# Patient Record
Sex: Female | Born: 1935 | Race: Black or African American | Hispanic: No | Smoking: Never smoker
Health system: Southern US, Community
[De-identification: ages and names within clinical notes are randomized; demographics above are authoritative.]

## PROBLEM LIST (undated history)

## (undated) DIAGNOSIS — I10 Essential (primary) hypertension: Secondary | ICD-10-CM

## (undated) DIAGNOSIS — E039 Hypothyroidism, unspecified: Secondary | ICD-10-CM

## (undated) DIAGNOSIS — J302 Other seasonal allergic rhinitis: Secondary | ICD-10-CM

## (undated) DIAGNOSIS — M1990 Unspecified osteoarthritis, unspecified site: Secondary | ICD-10-CM

## (undated) DIAGNOSIS — I639 Cerebral infarction, unspecified: Secondary | ICD-10-CM

## (undated) DIAGNOSIS — IMO0001 Reserved for inherently not codable concepts without codable children: Secondary | ICD-10-CM

## (undated) DIAGNOSIS — K5792 Diverticulitis of intestine, part unspecified, without perforation or abscess without bleeding: Secondary | ICD-10-CM

## (undated) DIAGNOSIS — K219 Gastro-esophageal reflux disease without esophagitis: Secondary | ICD-10-CM

## (undated) HISTORY — PX: OTHER SURGICAL HISTORY: SHX169

## (undated) HISTORY — PX: CHOLECYSTECTOMY: SHX55

## (undated) HISTORY — PX: CARDIAC CATHETERIZATION: SHX172

## (undated) HISTORY — PX: BACK SURGERY: SHX140

## (undated) HISTORY — PX: ROTATOR CUFF REPAIR: SHX139

## (undated) HISTORY — PX: BUNIONECTOMY: SHX129

---

## 2015-04-09 ENCOUNTER — Other Ambulatory Visit: Payer: Self-pay | Admitting: Specialist

## 2015-04-09 DIAGNOSIS — M545 Low back pain: Secondary | ICD-10-CM

## 2015-04-23 ENCOUNTER — Ambulatory Visit
Admission: RE | Admit: 2015-04-23 | Discharge: 2015-04-23 | Disposition: A | Discharge status: Home / Self Care | Payer: Medicare Other | Source: Ambulatory Visit | Attending: Specialist | Admitting: Specialist

## 2015-04-23 DIAGNOSIS — M545 Low back pain: Secondary | ICD-10-CM

## 2015-05-13 ENCOUNTER — Other Ambulatory Visit (HOSPITAL_COMMUNITY): Payer: Self-pay | Admitting: Specialist

## 2015-05-20 ENCOUNTER — Encounter (HOSPITAL_COMMUNITY)
Admission: RE | Admit: 2015-05-20 | Discharge: 2015-05-20 | Disposition: A | Discharge status: Home / Self Care | Payer: Medicare Other | Source: Ambulatory Visit | Attending: Specialist | Admitting: Specialist

## 2015-05-20 ENCOUNTER — Encounter (HOSPITAL_COMMUNITY): Payer: Self-pay

## 2015-05-20 DIAGNOSIS — R001 Bradycardia, unspecified: Secondary | ICD-10-CM | POA: Diagnosis not present

## 2015-05-20 DIAGNOSIS — M4806 Spinal stenosis, lumbar region: Secondary | ICD-10-CM | POA: Diagnosis not present

## 2015-05-20 DIAGNOSIS — I10 Essential (primary) hypertension: Secondary | ICD-10-CM | POA: Diagnosis not present

## 2015-05-20 DIAGNOSIS — K219 Gastro-esophageal reflux disease without esophagitis: Secondary | ICD-10-CM | POA: Diagnosis not present

## 2015-05-20 DIAGNOSIS — Z7902 Long term (current) use of antithrombotics/antiplatelets: Secondary | ICD-10-CM | POA: Diagnosis not present

## 2015-05-20 DIAGNOSIS — Z8673 Personal history of transient ischemic attack (TIA), and cerebral infarction without residual deficits: Secondary | ICD-10-CM | POA: Insufficient documentation

## 2015-05-20 DIAGNOSIS — Z79899 Other long term (current) drug therapy: Secondary | ICD-10-CM | POA: Diagnosis not present

## 2015-05-20 DIAGNOSIS — Z0183 Encounter for blood typing: Secondary | ICD-10-CM | POA: Insufficient documentation

## 2015-05-20 DIAGNOSIS — Z87891 Personal history of nicotine dependence: Secondary | ICD-10-CM | POA: Insufficient documentation

## 2015-05-20 DIAGNOSIS — M4316 Spondylolisthesis, lumbar region: Secondary | ICD-10-CM | POA: Insufficient documentation

## 2015-05-20 DIAGNOSIS — Z7982 Long term (current) use of aspirin: Secondary | ICD-10-CM | POA: Insufficient documentation

## 2015-05-20 DIAGNOSIS — E039 Hypothyroidism, unspecified: Secondary | ICD-10-CM | POA: Diagnosis not present

## 2015-05-20 DIAGNOSIS — Z01812 Encounter for preprocedural laboratory examination: Secondary | ICD-10-CM | POA: Diagnosis not present

## 2015-05-20 DIAGNOSIS — Z01818 Encounter for other preprocedural examination: Secondary | ICD-10-CM | POA: Diagnosis not present

## 2015-05-20 HISTORY — DX: Diverticulitis of intestine, part unspecified, without perforation or abscess without bleeding: K57.92

## 2015-05-20 HISTORY — DX: Gastro-esophageal reflux disease without esophagitis: K21.9

## 2015-05-20 HISTORY — DX: Cerebral infarction, unspecified: I63.9

## 2015-05-20 HISTORY — DX: Essential (primary) hypertension: I10

## 2015-05-20 HISTORY — DX: Hypothyroidism, unspecified: E03.9

## 2015-05-20 HISTORY — DX: Unspecified osteoarthritis, unspecified site: M19.90

## 2015-05-20 HISTORY — DX: Reserved for inherently not codable concepts without codable children: IMO0001

## 2015-05-20 HISTORY — DX: Other seasonal allergic rhinitis: J30.2

## 2015-05-20 LAB — COMPREHENSIVE METABOLIC PANEL
ALT: 20 U/L (ref 14–54)
ANION GAP: 10 (ref 5–15)
AST: 26 U/L (ref 15–41)
Albumin: 3.8 g/dL (ref 3.5–5.0)
Alkaline Phosphatase: 57 U/L (ref 38–126)
BILIRUBIN TOTAL: 0.4 mg/dL (ref 0.3–1.2)
BUN: 24 mg/dL — ABNORMAL HIGH (ref 6–20)
CO2: 25 mmol/L (ref 22–32)
Calcium: 9.4 mg/dL (ref 8.9–10.3)
Chloride: 106 mmol/L (ref 101–111)
Creatinine, Ser: 1.67 mg/dL — ABNORMAL HIGH (ref 0.44–1.00)
GFR calc Af Amer: 32 mL/min — ABNORMAL LOW (ref >60)
GFR calc non Af Amer: 28 mL/min — ABNORMAL LOW (ref >60)
Glucose, Bld: 88 mg/dL (ref 65–99)
Potassium: 3.7 mmol/L (ref 3.5–5.1)
Sodium: 141 mmol/L (ref 135–145)
TOTAL PROTEIN: 7.3 g/dL (ref 6.5–8.1)

## 2015-05-20 LAB — CBC
HCT: 33.5 % — ABNORMAL LOW (ref 36.0–46.0)
Hemoglobin: 10.5 g/dL — ABNORMAL LOW (ref 12.0–15.0)
MCH: 29.2 pg (ref 26.0–34.0)
MCHC: 31.3 g/dL (ref 30.0–36.0)
MCV: 93.1 fL (ref 78.0–100.0)
Platelets: 232 10*3/uL (ref 150–400)
RBC: 3.6 MIL/uL — AB (ref 3.87–5.11)
RDW: 13.8 % (ref 11.5–15.5)
WBC: 6.1 10*3/uL (ref 4.0–10.5)

## 2015-05-20 LAB — SURGICAL PCR SCREEN
MRSA, PCR: NEGATIVE
Staphylococcus aureus: NEGATIVE

## 2015-05-20 NOTE — Pre-Procedure Instructions (Signed)
    Caroline Ware  05/20/2015      [name] - [place], [place] - [address] AT [facility] [address] [place] [address] Phone: [phone] Fax: [phone]    Your procedure is scheduled on Tuesday, June 14th, 2016 at 10:05.  Report to [name] Admitting at 8:05 A.M.  Call this number if you have problems the morning of surgery:  [phone]   Remember:  Do not eat food or drink liquids after midnight.  Take these medicines the morning of surgery with A SIP OF WATER Diazepam (Valium) if needed, Hydrocodone-acetaminophen (Norco) if needed, lansoprazole (Prevacid), synthroid.   Stop taking: Aleve, Aspirin, Ibuprofen, herbal medications, fish oils, Plavix.    Do not wear jewelry, make-up or nail polish.  Do not wear lotions, powders, or perfumes.  You may wear deodorant.  Do not shave 48 hours prior to surgery.    Do not bring valuables to the hospital.  [name] is not responsible for any belongings or valuables.  Contacts, dentures or bridgework may not be worn into surgery.  Leave your suitcase in the car.  After surgery it may be brought to your room.  For patients admitted to the hospital, discharge time will be determined by your treatment team.  Patients discharged the day of surgery will not be allowed to drive home.    Special instructions:  See attached  Please read over the following fact sheets that you were given. Pain Booklet, Coughing and Deep Breathing, Blood Transfusion Information, MRSA Information and Surgical Site Infection Prevention

## 2015-05-20 NOTE — Progress Notes (Signed)
PCP- pt. States she see Dr. Melvyn Neth at [facility], [phone]  Cardiologist- Pt. Denies seeing a cardiologist within the last 5 years  Cardiac Cath- Pt. States it has been greater than 5 years and does not remember the date  Echo- pt. Denies  Stress test- pt. Denies within last 5 years

## 2015-05-21 NOTE — Progress Notes (Addendum)
Anesthesia Chart Review:  Pt is 81 year old female scheduled for L3-4, L4-5 lumbar decompressive laminectomy, L4-5 transforaminal lumbar interbody fusion on 05/28/2015 with Dr. Otelia Sergeant.   PCP is Dr. Early Chars at [facility] in [place]  PMH includes: HTN, TIA (09/2014; by Dr. Melvyn Neth' notes carotids and echo "ok"), hypothyroidism, GERD. Former smoker. BMI 34.   Medications include: ASA, plavix, losartan, pravachol, dyazide, prevacid, synthroid. Per Dr. Melvyn Neth' note in care everywhere dated 05/17/15, pt can stop plavix 10-14 days prior to surgery if needed.   Preoperative labs reviewed.  Cr 1.67. Previous results in care everywhere from 2015 show pt's cr ranging from 1.32-1.46  EKG 05/20/15: sinus bradycardia (49 bpm).   Pt reports having cardiac cath over 5 years ago, but cannot remember date. Has office visits with PCP in care everywhere dating back to 2008 and he makes no mention of CAD in his notes, in fact he specifically states pt has no hx of cardiac ischemia.   Pt has medical clearance for procedure from Dr. Melvyn Neth conditionally on labs to be obtained at PAT. I have faxed him pt's lab results and asked if he feels pt is ok for surgery. Awaiting response.   Rica Mast, FNP-BC [facility] Stay Surgical Center/Anesthesiology Phone: [phone] 05/21/2015 4:49 PM  Addendum:  Sherron Monday with Merry Proud, Dr. Melvyn Neth' nurse at [facility]. Dr. Melvyn Neth feels pt is ok to proceed with surgery with a cr of 1.67.   If no changes, I anticipate pt can proceed with surgery as scheduled.   Rica Mast, FNP-BC [facility] Short Stay Surgical Center/Anesthesiology Phone: [phone] 05/24/2015 1:12 PM

## 2015-05-25 ENCOUNTER — Encounter (HOSPITAL_COMMUNITY): Payer: Self-pay | Admitting: *Deleted

## 2015-05-25 ENCOUNTER — Inpatient Hospital Stay (HOSPITAL_COMMUNITY)
Admission: RE | Admit: 2015-05-25 | Discharge: 2015-05-28 | DRG: 460 | Disposition: A | Discharge status: Skilled Nursing Facility | Payer: Medicare Other | Source: Ambulatory Visit | Attending: Specialist | Admitting: Specialist

## 2015-05-25 ENCOUNTER — Inpatient Hospital Stay (HOSPITAL_COMMUNITY): Payer: Medicare Other | Admitting: Emergency Medicine

## 2015-05-25 ENCOUNTER — Inpatient Hospital Stay (HOSPITAL_COMMUNITY): Payer: Medicare Other | Admitting: Anesthesiology

## 2015-05-25 ENCOUNTER — Encounter (HOSPITAL_COMMUNITY)
Admission: RE | Disposition: A | Discharge status: Skilled Nursing Facility | Payer: Medicare Other | Source: Ambulatory Visit | Attending: Specialist

## 2015-05-25 ENCOUNTER — Inpatient Hospital Stay (HOSPITAL_COMMUNITY): Payer: Medicare Other

## 2015-05-25 DIAGNOSIS — Z91041 Radiographic dye allergy status: Secondary | ICD-10-CM | POA: Diagnosis not present

## 2015-05-25 DIAGNOSIS — I10 Essential (primary) hypertension: Secondary | ICD-10-CM | POA: Diagnosis present

## 2015-05-25 DIAGNOSIS — E039 Hypothyroidism, unspecified: Secondary | ICD-10-CM | POA: Diagnosis present

## 2015-05-25 DIAGNOSIS — Z981 Arthrodesis status: Secondary | ICD-10-CM | POA: Diagnosis not present

## 2015-05-25 DIAGNOSIS — M549 Dorsalgia, unspecified: Secondary | ICD-10-CM | POA: Diagnosis present

## 2015-05-25 DIAGNOSIS — Z87891 Personal history of nicotine dependence: Secondary | ICD-10-CM

## 2015-05-25 DIAGNOSIS — M48062 Spinal stenosis, lumbar region with neurogenic claudication: Secondary | ICD-10-CM | POA: Diagnosis present

## 2015-05-25 DIAGNOSIS — Z969 Presence of functional implant, unspecified: Secondary | ICD-10-CM

## 2015-05-25 DIAGNOSIS — Z7902 Long term (current) use of antithrombotics/antiplatelets: Secondary | ICD-10-CM

## 2015-05-25 DIAGNOSIS — Z79891 Long term (current) use of opiate analgesic: Secondary | ICD-10-CM

## 2015-05-25 DIAGNOSIS — Z91013 Allergy to seafood: Secondary | ICD-10-CM

## 2015-05-25 DIAGNOSIS — Z7982 Long term (current) use of aspirin: Secondary | ICD-10-CM | POA: Diagnosis not present

## 2015-05-25 DIAGNOSIS — Z880 Allergy status to penicillin: Secondary | ICD-10-CM | POA: Diagnosis not present

## 2015-05-25 DIAGNOSIS — M4316 Spondylolisthesis, lumbar region: Secondary | ICD-10-CM | POA: Diagnosis present

## 2015-05-25 DIAGNOSIS — J302 Other seasonal allergic rhinitis: Secondary | ICD-10-CM | POA: Diagnosis present

## 2015-05-25 DIAGNOSIS — M4806 Spinal stenosis, lumbar region: Principal | ICD-10-CM | POA: Diagnosis present

## 2015-05-25 DIAGNOSIS — Z888 Allergy status to other drugs, medicaments and biological substances status: Secondary | ICD-10-CM

## 2015-05-25 DIAGNOSIS — I959 Hypotension, unspecified: Secondary | ICD-10-CM | POA: Diagnosis not present

## 2015-05-25 DIAGNOSIS — R509 Fever, unspecified: Secondary | ICD-10-CM

## 2015-05-25 DIAGNOSIS — Z91010 Allergy to peanuts: Secondary | ICD-10-CM | POA: Diagnosis not present

## 2015-05-25 DIAGNOSIS — Z91030 Bee allergy status: Secondary | ICD-10-CM | POA: Diagnosis not present

## 2015-05-25 DIAGNOSIS — K219 Gastro-esophageal reflux disease without esophagitis: Secondary | ICD-10-CM | POA: Diagnosis present

## 2015-05-25 DIAGNOSIS — D62 Acute posthemorrhagic anemia: Secondary | ICD-10-CM | POA: Diagnosis not present

## 2015-05-25 DIAGNOSIS — Z79899 Other long term (current) drug therapy: Secondary | ICD-10-CM | POA: Diagnosis not present

## 2015-05-25 DIAGNOSIS — Z8673 Personal history of transient ischemic attack (TIA), and cerebral infarction without residual deficits: Secondary | ICD-10-CM

## 2015-05-25 DIAGNOSIS — N390 Urinary tract infection, site not specified: Secondary | ICD-10-CM | POA: Diagnosis not present

## 2015-05-25 DIAGNOSIS — J9811 Atelectasis: Secondary | ICD-10-CM | POA: Diagnosis not present

## 2015-05-25 DIAGNOSIS — Z419 Encounter for procedure for purposes other than remedying health state, unspecified: Secondary | ICD-10-CM

## 2015-05-25 HISTORY — PX: LUMBAR FUSION: SHX111

## 2015-05-25 LAB — TYPE AND SCREEN
ABO/RH(D): A NEG
ANTIBODY SCREEN: POSITIVE

## 2015-05-25 SURGERY — FUSION, SPINE, LUMBAR, MINIMALLY INVASIVE
Anesthesia: General | Site: Spine Lumbar

## 2015-05-25 MED ORDER — VANCOMYCIN HCL IN DEXTROSE 1-5 GM/200ML-% IV SOLN
1000.0000 mg | INTRAVENOUS | Status: AC
  Administered 2015-05-25: 1000 mg via INTRAVENOUS
  Filled 2015-05-25: qty 200

## 2015-05-25 MED ORDER — LIDOCAINE HCL (CARDIAC) 20 MG/ML IV SOLN
INTRAVENOUS | Status: AC
  Filled 2015-05-25: qty 5

## 2015-05-25 MED ORDER — TIMOLOL MALEATE 0.5 % OP SOLN
1.0000 [drp] | Freq: Two times a day (BID) | OPHTHALMIC | Status: DC
  Administered 2015-05-25 – 2015-05-28 (×6): 1 [drp] via OPHTHALMIC
  Filled 2015-05-25: qty 5

## 2015-05-25 MED ORDER — ONDANSETRON HCL 4 MG/2ML IJ SOLN: 4.0000 mg | Freq: Four times a day (QID) | INTRAMUSCULAR | Status: DC | PRN

## 2015-05-25 MED ORDER — CHLORHEXIDINE GLUCONATE 4 % EX LIQD: 60.0000 mL | Freq: Once | CUTANEOUS | Status: DC

## 2015-05-25 MED ORDER — ALBUMIN HUMAN 5 % IV SOLN
INTRAVENOUS | Status: DC | PRN
  Administered 2015-05-25 (×2): via INTRAVENOUS

## 2015-05-25 MED ORDER — BUPIVACAINE HCL 0.5 % IJ SOLN
INTRAMUSCULAR | Status: DC | PRN
  Administered 2015-05-25: 20 mL

## 2015-05-25 MED ORDER — HEMOSTATIC AGENTS (NO CHARGE) OPTIME
TOPICAL | Status: DC | PRN
  Administered 2015-05-25: 1 via TOPICAL

## 2015-05-25 MED ORDER — PHENYLEPHRINE HCL 10 MG/ML IJ SOLN
INTRAMUSCULAR | Status: AC
  Filled 2015-05-25: qty 3

## 2015-05-25 MED ORDER — BUPIVACAINE LIPOSOME 1.3 % IJ SUSP
20.0000 mL | INTRAMUSCULAR | Status: DC
  Filled 2015-05-25: qty 20

## 2015-05-25 MED ORDER — SUCCINYLCHOLINE CHLORIDE 20 MG/ML IJ SOLN
INTRAMUSCULAR | Status: AC
  Filled 2015-05-25: qty 1

## 2015-05-25 MED ORDER — THROMBIN 20000 UNITS EX KIT
PACK | CUTANEOUS | Status: DC | PRN
  Administered 2015-05-25: 20000 [IU] via TOPICAL

## 2015-05-25 MED ORDER — POTASSIUM CHLORIDE IN NACL 20-0.9 MEQ/L-% IV SOLN
INTRAVENOUS | Status: DC
Start: 2015-05-25 — End: 2015-05-28
  Administered 2015-05-25 – 2015-05-27 (×5): via INTRAVENOUS
  Filled 2015-05-25 (×5): qty 1000

## 2015-05-25 MED ORDER — POLYETHYLENE GLYCOL 3350 17 G PO PACK: 17.0000 g | PACK | Freq: Every day | ORAL | Status: DC | PRN

## 2015-05-25 MED ORDER — ONDANSETRON HCL 4 MG/2ML IJ SOLN: 4.0000 mg | INTRAMUSCULAR | Status: DC | PRN

## 2015-05-25 MED ORDER — BRIMONIDINE TARTRATE-TIMOLOL 0.2-0.5 % OP SOLN: 1.0000 [drp] | Freq: Two times a day (BID) | OPHTHALMIC | Status: DC

## 2015-05-25 MED ORDER — DOCUSATE SODIUM 100 MG PO CAPS
100.0000 mg | ORAL_CAPSULE | Freq: Two times a day (BID) | ORAL | Status: DC
  Administered 2015-05-25 – 2015-05-26 (×3): 100 mg via ORAL
  Filled 2015-05-25 (×3): qty 1

## 2015-05-25 MED ORDER — BISACODYL 5 MG PO TBEC: 5.0000 mg | DELAYED_RELEASE_TABLET | Freq: Every day | ORAL | Status: DC | PRN

## 2015-05-25 MED ORDER — PROPOFOL 10 MG/ML IV BOLUS
INTRAVENOUS | Status: DC | PRN
  Administered 2015-05-25: 120 mg via INTRAVENOUS

## 2015-05-25 MED ORDER — FLEET ENEMA 7-19 GM/118ML RE ENEM: 1.0000 | ENEMA | Freq: Once | RECTAL | Status: AC | PRN

## 2015-05-25 MED ORDER — PHENOL 1.4 % MT LIQD: 1.0000 | OROMUCOSAL | Status: DC | PRN

## 2015-05-25 MED ORDER — OXYCODONE HCL 5 MG/5ML PO SOLN: 5.0000 mg | Freq: Once | ORAL | Status: DC | PRN

## 2015-05-25 MED ORDER — MENTHOL 3 MG MT LOZG: 1.0000 | LOZENGE | OROMUCOSAL | Status: DC | PRN

## 2015-05-25 MED ORDER — NEOSTIGMINE METHYLSULFATE 10 MG/10ML IV SOLN
INTRAVENOUS | Status: DC | PRN
  Administered 2015-05-25: 4 mg via INTRAVENOUS

## 2015-05-25 MED ORDER — EPHEDRINE SULFATE 50 MG/ML IJ SOLN
INTRAMUSCULAR | Status: DC | PRN
  Administered 2015-05-25 (×3): 10 mg via INTRAVENOUS
  Administered 2015-05-25: 5 mg via INTRAVENOUS

## 2015-05-25 MED ORDER — PROPOFOL 10 MG/ML IV BOLUS
INTRAVENOUS | Status: AC
  Filled 2015-05-25: qty 20

## 2015-05-25 MED ORDER — HYDROCODONE-ACETAMINOPHEN 5-325 MG PO TABS
1.0000 | ORAL_TABLET | ORAL | Status: DC | PRN
  Administered 2015-05-27: 1 via ORAL
  Filled 2015-05-25: qty 1

## 2015-05-25 MED ORDER — FENTANYL CITRATE (PF) 100 MCG/2ML IJ SOLN
25.0000 ug | INTRAMUSCULAR | Status: DC | PRN
  Administered 2015-05-25: 50 ug via INTRAVENOUS

## 2015-05-25 MED ORDER — LOSARTAN POTASSIUM 50 MG PO TABS
50.0000 mg | ORAL_TABLET | Freq: Every day | ORAL | Status: DC
  Administered 2015-05-28: 50 mg via ORAL
  Filled 2015-05-25 (×2): qty 1

## 2015-05-25 MED ORDER — ROCURONIUM BROMIDE 100 MG/10ML IV SOLN
INTRAVENOUS | Status: DC | PRN
  Administered 2015-05-25: 40 mg via INTRAVENOUS
  Administered 2015-05-25 (×5): 10 mg via INTRAVENOUS

## 2015-05-25 MED ORDER — ALUM & MAG HYDROXIDE-SIMETH 200-200-20 MG/5ML PO SUSP: 30.0000 mL | Freq: Four times a day (QID) | ORAL | Status: DC | PRN

## 2015-05-25 MED ORDER — SURGIFOAM 100 EX MISC
CUTANEOUS | Status: DC | PRN
  Administered 2015-05-25: 1 via TOPICAL

## 2015-05-25 MED ORDER — VANCOMYCIN HCL IN DEXTROSE 1-5 GM/200ML-% IV SOLN
1000.0000 mg | Freq: Once | INTRAVENOUS | Status: AC
  Administered 2015-05-26: 1000 mg via INTRAVENOUS
  Filled 2015-05-25: qty 200

## 2015-05-25 MED ORDER — LIDOCAINE HCL (CARDIAC) 20 MG/ML IV SOLN
INTRAVENOUS | Status: DC | PRN
  Administered 2015-05-25: 60 mg via INTRAVENOUS

## 2015-05-25 MED ORDER — LACTATED RINGERS IV SOLN
INTRAVENOUS | Status: DC
  Administered 2015-05-25 (×4): via INTRAVENOUS

## 2015-05-25 MED ORDER — DIPHENHYDRAMINE HCL 50 MG/ML IJ SOLN
12.5000 mg | Freq: Once | INTRAMUSCULAR | Status: AC
  Administered 2015-05-25: 12.5 mg via INTRAVENOUS
  Filled 2015-05-25: qty 0.25
  Filled 2015-05-25: qty 1

## 2015-05-25 MED ORDER — THROMBIN 20000 UNITS EX SOLR
CUTANEOUS | Status: AC
  Filled 2015-05-25: qty 20000

## 2015-05-25 MED ORDER — FENTANYL CITRATE (PF) 100 MCG/2ML IJ SOLN
INTRAMUSCULAR | Status: AC
  Administered 2015-05-25: 50 ug via INTRAVENOUS
  Filled 2015-05-25: qty 2

## 2015-05-25 MED ORDER — FENTANYL CITRATE (PF) 250 MCG/5ML IJ SOLN
INTRAMUSCULAR | Status: AC
  Filled 2015-05-25: qty 5

## 2015-05-25 MED ORDER — SODIUM CHLORIDE 0.9 % IJ SOLN: 3.0000 mL | Freq: Two times a day (BID) | INTRAMUSCULAR | Status: DC

## 2015-05-25 MED ORDER — TRIAMTERENE-HCTZ 37.5-25 MG PO CAPS
1.0000 | ORAL_CAPSULE | Freq: Every day | ORAL | Status: DC
  Filled 2015-05-25: qty 1

## 2015-05-25 MED ORDER — SODIUM CHLORIDE 0.9 % IJ SOLN: 3.0000 mL | INTRAMUSCULAR | Status: DC | PRN

## 2015-05-25 MED ORDER — ARTIFICIAL TEARS OP OINT
TOPICAL_OINTMENT | OPHTHALMIC | Status: AC
  Filled 2015-05-25: qty 3.5

## 2015-05-25 MED ORDER — MORPHINE SULFATE 2 MG/ML IJ SOLN
1.0000 mg | INTRAMUSCULAR | Status: DC | PRN
  Administered 2015-05-25 – 2015-05-26 (×2): 2 mg via INTRAVENOUS
  Filled 2015-05-25 (×3): qty 1

## 2015-05-25 MED ORDER — SODIUM CHLORIDE 0.9 % IV SOLN: 250.0000 mL | INTRAVENOUS | Status: DC

## 2015-05-25 MED ORDER — 0.9 % SODIUM CHLORIDE (POUR BTL) OPTIME
TOPICAL | Status: DC | PRN
  Administered 2015-05-25 (×2): 1000 mL

## 2015-05-25 MED ORDER — PHENYLEPHRINE HCL 10 MG/ML IJ SOLN
INTRAMUSCULAR | Status: DC | PRN
  Administered 2015-05-25: 80 ug via INTRAVENOUS
  Administered 2015-05-25: 40 ug via INTRAVENOUS
  Administered 2015-05-25: 80 ug via INTRAVENOUS

## 2015-05-25 MED ORDER — OXYCODONE-ACETAMINOPHEN 5-325 MG PO TABS
1.0000 | ORAL_TABLET | ORAL | Status: DC | PRN
  Administered 2015-05-26: 2 via ORAL
  Filled 2015-05-25 (×2): qty 2

## 2015-05-25 MED ORDER — LATANOPROST 0.005 % OP SOLN
1.0000 [drp] | Freq: Every day | OPHTHALMIC | Status: DC
  Administered 2015-05-25 – 2015-05-28 (×4): 1 [drp] via OPHTHALMIC
  Filled 2015-05-25: qty 2.5

## 2015-05-25 MED ORDER — ACETAMINOPHEN 325 MG PO TABS
650.0000 mg | ORAL_TABLET | ORAL | Status: DC | PRN
  Administered 2015-05-27 (×3): 650 mg via ORAL
  Filled 2015-05-25 (×3): qty 2

## 2015-05-25 MED ORDER — BRIMONIDINE TARTRATE 0.2 % OP SOLN
1.0000 [drp] | Freq: Two times a day (BID) | OPHTHALMIC | Status: DC
  Administered 2015-05-25 – 2015-05-28 (×6): 1 [drp] via OPHTHALMIC
  Filled 2015-05-25: qty 5

## 2015-05-25 MED ORDER — LEVOTHYROXINE SODIUM 50 MCG PO TABS
50.0000 ug | ORAL_TABLET | Freq: Every day | ORAL | Status: DC
  Administered 2015-05-26 – 2015-05-28 (×3): 50 ug via ORAL
  Filled 2015-05-25 (×3): qty 1

## 2015-05-25 MED ORDER — ASPIRIN EC 81 MG PO TBEC
81.0000 mg | DELAYED_RELEASE_TABLET | Freq: Every day | ORAL | Status: DC
  Administered 2015-05-25 – 2015-05-27 (×3): 81 mg via ORAL
  Filled 2015-05-25 (×6): qty 1

## 2015-05-25 MED ORDER — GLYCOPYRROLATE 0.2 MG/ML IJ SOLN
INTRAMUSCULAR | Status: DC | PRN
  Administered 2015-05-25 (×2): 0.2 mg via INTRAVENOUS
  Administered 2015-05-25: 0.6 mg via INTRAVENOUS

## 2015-05-25 MED ORDER — ONDANSETRON HCL 4 MG/2ML IJ SOLN
INTRAMUSCULAR | Status: DC | PRN
  Administered 2015-05-25: 4 mg via INTRAVENOUS

## 2015-05-25 MED ORDER — ARTIFICIAL TEARS OP OINT
TOPICAL_OINTMENT | OPHTHALMIC | Status: DC | PRN
  Administered 2015-05-25: 1 via OPHTHALMIC

## 2015-05-25 MED ORDER — ACETAMINOPHEN 650 MG RE SUPP: 650.0000 mg | RECTAL | Status: DC | PRN

## 2015-05-25 MED ORDER — ROCURONIUM BROMIDE 50 MG/5ML IV SOLN
INTRAVENOUS | Status: AC
  Filled 2015-05-25: qty 1

## 2015-05-25 MED ORDER — PRAVASTATIN SODIUM 20 MG PO TABS
20.0000 mg | ORAL_TABLET | Freq: Every day | ORAL | Status: DC
  Administered 2015-05-26 – 2015-05-28 (×3): 20 mg via ORAL
  Filled 2015-05-25 (×3): qty 1

## 2015-05-25 MED ORDER — POTASSIUM CHLORIDE CRYS ER 20 MEQ PO TBCR
20.0000 meq | EXTENDED_RELEASE_TABLET | Freq: Every day | ORAL | Status: DC
  Administered 2015-05-26 – 2015-05-28 (×4): 20 meq via ORAL
  Filled 2015-05-25 (×3): qty 1

## 2015-05-25 MED ORDER — OXYCODONE HCL 5 MG PO TABS: 5.0000 mg | ORAL_TABLET | Freq: Once | ORAL | Status: DC | PRN

## 2015-05-25 MED ORDER — PHENYLEPHRINE HCL 10 MG/ML IJ SOLN
10.0000 mg | INTRAMUSCULAR | Status: DC | PRN
  Administered 2015-05-25: 20 ug/min via INTRAVENOUS

## 2015-05-25 MED ORDER — FENTANYL CITRATE (PF) 100 MCG/2ML IJ SOLN
INTRAMUSCULAR | Status: DC | PRN
  Administered 2015-05-25 (×5): 50 ug via INTRAVENOUS

## 2015-05-25 MED ORDER — CVS EASY FIBER PO PACK: 1.0000 | PACK | Freq: Every day | ORAL | Status: DC

## 2015-05-25 MED ORDER — TRIAMTERENE-HCTZ 37.5-25 MG PO TABS
1.0000 | ORAL_TABLET | Freq: Every day | ORAL | Status: DC
  Administered 2015-05-28: 1 via ORAL
  Filled 2015-05-25 (×2): qty 1

## 2015-05-25 MED ORDER — CALCIUM CARBONATE 600 MG PO TABS
600.0000 mg | ORAL_TABLET | Freq: Two times a day (BID) | ORAL | Status: DC
  Filled 2015-05-25 (×4): qty 1

## 2015-05-25 MED ORDER — BUPIVACAINE LIPOSOME 1.3 % IJ SUSP
INTRAMUSCULAR | Status: DC | PRN
  Administered 2015-05-25: 20 mL

## 2015-05-25 MED ORDER — ONDANSETRON HCL 4 MG/2ML IJ SOLN
INTRAMUSCULAR | Status: AC
  Filled 2015-05-25: qty 2

## 2015-05-25 MED ORDER — PANTOPRAZOLE SODIUM 40 MG PO TBEC
40.0000 mg | DELAYED_RELEASE_TABLET | Freq: Every day | ORAL | Status: DC
  Administered 2015-05-26 – 2015-05-28 (×3): 40 mg via ORAL
  Filled 2015-05-25 (×3): qty 1

## 2015-05-25 SURGICAL SUPPLY — 77 items
BAG DECANTER FOR FLEXI CONT (MISCELLANEOUS) ×3 IMPLANT
BLADE SURG ROTATE 9660 (MISCELLANEOUS) IMPLANT
BONE CANC CHIPS 20CC PCAN1/4 (Bone Implant) ×3 IMPLANT
BONE MATRIX VIVIGEN 1CC (Bone Implant) ×6 IMPLANT
BUR NEURO DRILL SOFT 3.0X3.8M (BURR) ×3 IMPLANT
BUR RND FLUTED 2.5 (BURR) IMPLANT
BUR SABER RD CUTTING 3.0 (BURR) ×2 IMPLANT
BUR SABER RD CUTTING 3.0MM (BURR) ×1
CAGE CONCORDE BULLET 9X11X23 (Cage) ×1 IMPLANT
CAGE CONCORDE BULLET 9X11X23MM (Cage) ×1 IMPLANT
CAGE SPNL PRLL BLT NOSE 23X9 (Cage) ×1 IMPLANT
CHIPS CANC BONE 20CC PCAN1/4 (Bone Implant) ×1 IMPLANT
CHLORAPREP W/TINT 26ML (MISCELLANEOUS) ×3 IMPLANT
CLOSURE WOUND 1/2 X4 (GAUZE/BANDAGES/DRESSINGS) ×1
COVER SURGICAL LIGHT HANDLE (MISCELLANEOUS) ×3 IMPLANT
DERMABOND ADVANCED (GAUZE/BANDAGES/DRESSINGS) ×2
DERMABOND ADVANCED .7 DNX12 (GAUZE/BANDAGES/DRESSINGS) ×1 IMPLANT
DRAPE C-ARM 42X72 X-RAY (DRAPES) ×3 IMPLANT
DRAPE C-ARMOR (DRAPES) ×3 IMPLANT
DRAPE INCISE 23X17 IOBAN STRL (DRAPES) ×4
DRAPE INCISE IOBAN 23X17 STRL (DRAPES) ×2 IMPLANT
DRAPE SURG 17X23 STRL (DRAPES) ×12 IMPLANT
DRAPE TABLE COVER HEAVY DUTY (DRAPES) ×3 IMPLANT
DRSG MEPILEX BORDER 4X4 (GAUZE/BANDAGES/DRESSINGS) IMPLANT
DRSG MEPILEX BORDER 4X8 (GAUZE/BANDAGES/DRESSINGS) ×3 IMPLANT
DURAPREP 26ML APPLICATOR (WOUND CARE) IMPLANT
ELECT BLADE 6.5 EXT (BLADE) ×3 IMPLANT
ELECT CAUTERY BLADE 6.4 (BLADE) ×3 IMPLANT
ELECT REM PT RETURN 9FT ADLT (ELECTROSURGICAL) ×3
ELECTRODE REM PT RTRN 9FT ADLT (ELECTROSURGICAL) ×1 IMPLANT
EVACUATOR 1/8 PVC DRAIN (DRAIN) IMPLANT
GAUZE SPONGE 4X4 12PLY STRL (GAUZE/BANDAGES/DRESSINGS) IMPLANT
GLOVE BIOGEL PI IND STRL 8 (GLOVE) ×2 IMPLANT
GLOVE BIOGEL PI INDICATOR 8 (GLOVE) ×4
GLOVE ECLIPSE 9.0 STRL (GLOVE) ×6 IMPLANT
GLOVE ORTHO TXT STRL SZ7.5 (GLOVE) ×6 IMPLANT
GLOVE SURG 8.5 LATEX PF (GLOVE) ×6 IMPLANT
GOWN STRL REUS W/ TWL LRG LVL3 (GOWN DISPOSABLE) ×3 IMPLANT
GOWN STRL REUS W/ TWL XL LVL3 (GOWN DISPOSABLE) ×2 IMPLANT
GOWN STRL REUS W/TWL 2XL LVL3 (GOWN DISPOSABLE) ×6 IMPLANT
GOWN STRL REUS W/TWL LRG LVL3 (GOWN DISPOSABLE) ×6
GOWN STRL REUS W/TWL XL LVL3 (GOWN DISPOSABLE) ×4
KIT BASIN OR (CUSTOM PROCEDURE TRAY) ×3 IMPLANT
KIT POSITION SURG JACKSON T1 (MISCELLANEOUS) ×3 IMPLANT
KIT ROOM TURNOVER OR (KITS) ×3 IMPLANT
NEEDLE 22X1 1/2 (OR ONLY) (NEEDLE) ×3 IMPLANT
NEEDLE ASP BONE MRW 11GX15 (NEEDLE) IMPLANT
NEEDLE BONE MARROW 8GAX6 (NEEDLE) IMPLANT
NEEDLE SPNL 18GX3.5 QUINCKE PK (NEEDLE) ×3 IMPLANT
NS IRRIG 1000ML POUR BTL (IV SOLUTION) ×6 IMPLANT
PACK LAMINECTOMY ORTHO (CUSTOM PROCEDURE TRAY) ×3 IMPLANT
PAD ARMBOARD 7.5X6 YLW CONV (MISCELLANEOUS) ×6 IMPLANT
PATTIES SURGICAL .75X.75 (GAUZE/BANDAGES/DRESSINGS) ×3 IMPLANT
PATTIES SURGICAL 1X1 (DISPOSABLE) IMPLANT
ROD PRE BENT EXPEDIUM 35MM (Rod) ×6 IMPLANT
SCREW SET SINGLE INNER (Screw) ×12 IMPLANT
SCREW VIPER CORT FIX 6.00X30 (Screw) ×3 IMPLANT
SCREW VIPER CORT FIX 6X35 (Screw) ×3 IMPLANT
SCREW VIPER CORTICAL FIX 6X40 (Screw) ×6 IMPLANT
SPONGE LAP 18X18 X RAY DECT (DISPOSABLE) ×3 IMPLANT
SPONGE SURGIFOAM ABS GEL 100 (HEMOSTASIS) ×3 IMPLANT
STRIP CLOSURE SKIN 1/2X4 (GAUZE/BANDAGES/DRESSINGS) ×2 IMPLANT
SURGIFLO W/THROMBIN 8M KIT (HEMOSTASIS) ×3 IMPLANT
SUT VIC AB 0 CT1 27 (SUTURE) ×2
SUT VIC AB 0 CT1 27XBRD ANBCTR (SUTURE) ×1 IMPLANT
SUT VIC AB 1 CTX 36 (SUTURE) ×2
SUT VIC AB 1 CTX36XBRD ANBCTR (SUTURE) ×1 IMPLANT
SUT VIC AB 2-0 CT1 27 (SUTURE) ×4
SUT VIC AB 2-0 CT1 TAPERPNT 27 (SUTURE) ×2 IMPLANT
SUT VIC AB 3-0 X1 27 (SUTURE) ×3 IMPLANT
SUT VICRYL 0 CT 1 36IN (SUTURE) IMPLANT
SYR 20CC LL (SYRINGE) ×3 IMPLANT
SYR CONTROL 10ML LL (SYRINGE) ×6 IMPLANT
TOWEL OR 17X24 6PK STRL BLUE (TOWEL DISPOSABLE) ×3 IMPLANT
TOWEL OR 17X26 10 PK STRL BLUE (TOWEL DISPOSABLE) ×3 IMPLANT
TRAY FOLEY CATH 16FRSI W/METER (SET/KITS/TRAYS/PACK) ×3 IMPLANT
YANKAUER SUCT BULB TIP NO VENT (SUCTIONS) ×3 IMPLANT

## 2015-05-25 NOTE — Brief Op Note (Addendum)
05/25/2015  3:49 PM  PATIENT:  Caroline Ware  81 y.o. female  PRE-OPERATIVE DIAGNOSIS:  Lumbar spinal stenosis L3-4, L4-5, grade II spondylolisthesis L4-5  POST-OPERATIVE DIAGNOSIS:  Lumbar spinal stenosis L3-4, L4-5, grade II spondylolisthesis L4-5  PROCEDURE:  Procedure(s): LUMBAR DECOMPRESSIVE LAMINECTOMY L3-4 AND L4-5, TRANSFORAMINAL LUMBAR INTERBODY FUSION L4-5 (N/A)Instrumentation with mPACT pedicle screws and rods L4-5, 11 concorde lordotic cage,vivigen.  SURGEON:  Surgeon(s) and Role: Kerrin Champagne, MD - Primary  PHYSICIAN ASSISTANT: Andee Lineman  ANESTHESIA:   local and general Dr. Chaney Malling.  EBL:  Total I/O In: 2050 [I.V.:1420; Blood:130; IV Piggyback:500] Out: 1350 [Urine:950; Blood:400]  BLOOD ADMINISTERED:0 CC CELLSAVER  DRAINS: Urinary Catheter (Foley)   LOCAL MEDICATIONS USED:  MARCAINE 1/2% 1:1 EXPAREL 1.3% Amount: 40 ml  SPECIMEN:  No Specimen  DISPOSITION OF SPECIMEN:  N/A  COUNTS:  YES  TOURNIQUET:  * No tourniquets in log *  DICTATION: .Dragon Dictation  PLAN OF CARE: Admit to inpatient   PATIENT DISPOSITION:  PACU - hemodynamically stable.   Delay start of Pharmacological VTE agent (>24hrs) due to surgical blood loss or risk of bleeding: yes

## 2015-05-25 NOTE — Consult Note (Signed)
PHARMACY CONSULT NOTE--POST-PROCEDURE ANTIBIOTICS   Consult for Post Procedure Dosing of:  Vancomycin Indication :  Empiric Post-Procedure Prophylaxis   Pharmacy consulted for post-procedure dosing of Vancomycin s/p spinal surgery.    Patient is to receive Vancomycin 12 hours after end of surgery.  Patient does NOT have a drain  Wt 82,  CrCl 26 ml/min.  PLAN:  1. Vancomycin 1 gm IV x 1 dose then discontinue as patient does NOT have a drain in place. 2. Pharmacy will sign off. Please reconsult if additional assistance is needed.   Thank you for allowing Pharmacy to participate in this patient's care.   Corneshia Hines, Colin Benton,  Pharm.D.,  05/25/2015,  5:55 PM

## 2015-05-25 NOTE — Interval H&P Note (Signed)
History and Physical Interval Note:  05/25/2015 10:04 AM  Caroline Ware  has presented today for surgery, with the diagnosis of Lumbar spinal stenosis L3-4, L4-5, grade II spondylolisthesis L4-5  The various methods of treatment have been discussed with the patient and family. After consideration of risks, benefits and other options for treatment, the patient has consented to  Procedure(s): LUMBAR DECOMPRESSIVE LAMINECTOMY L3-4 AND L4-5, TRANSFORAMINAL LUMBAR INTERBODY FUSION L4-5 (N/A) as a surgical intervention .  The patient's history has been reviewed, patient examined, no change in status, stable for surgery.  I have reviewed the patient's chart and labs.  Questions were answered to the patient's satisfaction.     Yoneko Talerico E

## 2015-05-25 NOTE — Anesthesia Preprocedure Evaluation (Signed)
Anesthesia Evaluation  Patient identified by MRN, date of birth, ID band Patient awake    Reviewed: Allergy & Precautions, NPO status , Patient's Chart, lab work & pertinent test results  Airway Mallampati: II   Neck ROM: full    Dental   Pulmonary shortness of breath,  breath sounds clear to auscultation        Cardiovascular hypertension, Rhythm:regular Rate:Normal     Neuro/Psych CVA    GI/Hepatic GERD-  ,  Endo/Other  Hypothyroidism obese  Renal/GU Renal InsufficiencyRenal disease     Musculoskeletal  (+) Arthritis -,   Abdominal   Peds  Hematology   Anesthesia Other Findings   Reproductive/Obstetrics                             Anesthesia Physical Anesthesia Plan  ASA: III  Anesthesia Plan: General   Post-op Pain Management:    Induction: Intravenous  Airway Management Planned: Oral ETT  Additional Equipment:   Intra-op Plan:   Post-operative Plan: Extubation in OR  Informed Consent: I have reviewed the patients History and Physical, chart, labs and discussed the procedure including the risks, benefits and alternatives for the proposed anesthesia with the patient or authorized representative who has indicated his/her understanding and acceptance.     Plan Discussed with: CRNA, Anesthesiologist and Surgeon  Anesthesia Plan Comments:         Anesthesia Quick Evaluation

## 2015-05-25 NOTE — Discharge Instructions (Signed)
° ° °  Call if there is increasing drainage, fever greater than 101.5, severe head aches, and °worsening nausea or light sensitivity. °If shortness of breath, bloody cough or chest tightness or pain go to an emergency room. °No lifting greater than 10 lbs. °Avoid bending, stooping and twisting. °Use brace when sitting and out of bed even to go to bathroom. °Walk in house for first 2 weeks then may start to get out slowly increasing distances up to one half mile by 4-6 weeks post op. °After 5 days may shower and change dressing following bathing with shower.When °bathing remove the brace shower and replace brace before getting out of the shower. °If drainage, keep dry dressing and do not bathe the incision, use an moisture impervious dressing. °Please call and return for scheduled follow up appointment 2 weeks from the time of surgery. ° ° °

## 2015-05-25 NOTE — Transfer of Care (Signed)
Immediate Anesthesia Transfer of Care Note  Patient: Caroline Ware  Procedure(s) Performed: Procedure(s): LUMBAR DECOMPRESSIVE LAMINECTOMY L3-4 AND L4-5, TRANSFORAMINAL LUMBAR INTERBODY FUSION L4-5 (N/A)  Patient Location: PACU  Anesthesia Type:General  Level of Consciousness: awake, alert , oriented and patient cooperative  Airway & Oxygen Therapy: Patient Spontanous Breathing and Patient connected to face mask oxygen  Post-op Assessment: Report given to RN, Post -op Vital signs reviewed and stable, Patient moving all extremities, Patient moving all extremities X 4 and Patient able to stick tongue midline  Post vital signs: Reviewed and stable  Last Vitals:  Filed Vitals:   05/25/15 0843  BP: 139/43  Pulse: 45  Temp: 36.5 C  Resp: 16    Complications: No apparent anesthesia complications

## 2015-05-25 NOTE — H&P (Addendum)
Caroline Ware is an 81 y.o. female.    This 81 year old female is seen today to review MRI scan she had of her lumbar spine.  First seen in the early part of last month complaining of severe back pain and difficulties with standing and ambulation.  Pain into her left groin and left anterior thigh with standing ambulation.  Pain has been severe over 3 months.  She has had previous lumbar surgery in the past discectomy surgery.  She is not certain what levels or what was done.  Has had a history of previous lumbar epidural steroid injections performed by doctors in [place] this last year.  Not been of any benefit.  She has undergone MRI scan of her dorsal spine for workup of problems of gradual progressive weakness with standing and ambulation.  Has limited ability to stand and walk more than 100 feet.  Her primary care physician is Dr. Junious Silk. [facility] in [place], [place].    RADIOGRAPHS:  MRI scan of her dorsal spine was from August and it showed mild degenerative changes in her thoracic spine.  Some mild canal encroachment, moderate right foraminal encroachment at T12-L1.        Past Medical History  Diagnosis Date  . Hypertension     take Losartan  . Stroke   . Shortness of breath dyspnea     with exertion  . Hypothyroidism     takes synthroid  . Seasonal allergies   . GERD (gastroesophageal reflux disease)   . Arthritis   . Diverticulitis     Past Surgical History  Procedure Laterality Date  . Rotator cuff repair Bilateral   . Cholecystectomy    . Bunionectomy    . Hand Right   . Cardiac catheterization    . Colonoscopy    . Back surgery      History reviewed. No pertinent family history. Social History:  reports that she has never smoked. She does not have any smokeless tobacco history on file. She reports that she drinks alcohol. She reports that she does not use illicit drugs.  Allergies:  Allergies  Allergen Reactions  . Betadine [Povidone Iodine] Swelling  .  Iodine Swelling  . Penicillins Anaphylaxis and Swelling  . Bee Venom Swelling  . Fish Allergy Swelling  . Ivp Dye [Iodinated Diagnostic Agents]   . Nexium [Esomeprazole Magnesium]   . Peanuts [Peanut Oil] Swelling  . Tetanus Toxoids Swelling    Medications Prior to Admission  Medication Sig Dispense Refill  . aspirin 81 MG tablet Take 81 mg by mouth at bedtime.    . Biotin w/ Vitamins C & E (HAIR SKIN & NAILS GUMMIES PO) Take 1 tablet by mouth daily.    . calcium carbonate (OS-CAL) 600 MG TABS tablet Take 600 mg by mouth 2 (two) times daily with a meal.    . clopidogrel (PLAVIX) 75 MG tablet Take 75 mg by mouth daily.  3  . COMBIGAN 0.2-0.5 % ophthalmic solution Place 1 drop into both eyes every 12 (twelve) hours.   11  . diazepam (VALIUM) 5 MG tablet Take 5 mg by mouth every 8 (eight) hours as needed for muscle spasms.   0  . HYDROcodone-acetaminophen (NORCO/VICODIN) 5-325 MG per tablet Take 1 tablet by mouth every 6 (six) hours as needed for moderate pain.    Marland Kitchen lansoprazole (PREVACID) 30 MG capsule Take 30 mg by mouth daily at 12 noon.    Marland Kitchen losartan (COZAAR) 50 MG tablet Take 50 mg by mouth  daily.  3  . LUMIGAN 0.01 % SOLN Place 1 drop into both eyes at bedtime.   0  . Multiple Vitamins-Minerals (SENIOR MULTIVITAMIN PLUS PO) Take 1 tablet by mouth daily.    . potassium chloride SA (K-DUR,KLOR-CON) 20 MEQ tablet Take 20 mEq by mouth daily.  0  . pravastatin (PRAVACHOL) 20 MG tablet Take 20 mg by mouth daily after breakfast.   3  . SYNTHROID 50 MCG tablet Take 50 mcg by mouth daily before breakfast.   0  . triamterene-hydrochlorothiazide (DYAZIDE) 37.5-25 MG per capsule Take 1 capsule by mouth daily.   6  . Corn Dextrin ([employer] EASY FIBER) PACK Take 1 Package by mouth daily.      Results for orders placed or performed during the hospital encounter of 05/25/15 (from the past 48 hour(s))  Type and screen     Status: None   Collection Time: 05/25/15  8:20 AM  Result Value Ref Range    ABO/RH(D) A NEG    Antibody Screen POS    Sample Expiration 05/28/2015    No results found.  Review of Systems  Constitutional: Negative.   HENT: Negative.   Respiratory: Negative.   Cardiovascular: Negative.   Gastrointestinal: Negative.   Genitourinary: Positive for urgency and frequency. Negative for dysuria, hematuria and flank pain.  Musculoskeletal: Positive for back pain.  Skin: Negative.   Neurological: Positive for tingling, sensory change and focal weakness. Negative for dizziness, tremors, speech change, seizures and loss of consciousness.  Endo/Heme/Allergies: Negative for environmental allergies and polydipsia. Does not bruise/bleed easily.  Psychiatric/Behavioral: Negative.  Negative for depression, suicidal ideas, hallucinations, memory loss and substance abuse. The patient is not nervous/anxious and does not have insomnia.     Blood pressure 139/43, pulse 45, temperature 97.7 F (36.5 C), temperature source Oral, resp. rate 16, weight 81.647 kg (180 lb), SpO2 100 %. Physical Exam  Constitutional: She appears well-developed and well-nourished. No distress.  HENT:  Head: Normocephalic and atraumatic.  Right Ear: External ear normal.  Left Ear: External ear normal.  Nose: Nose normal.  Mouth/Throat: Oropharynx is clear and moist. No oropharyngeal exudate.  Eyes: Conjunctivae and EOM are normal. Pupils are equal, round, and reactive to light. Right eye exhibits no discharge. Left eye exhibits no discharge. No scleral icterus.  Neck: Normal range of motion. Neck supple. No JVD present. No tracheal deviation present. No thyromegaly present.  Cardiovascular: Normal rate, regular rhythm, normal heart sounds and intact distal pulses.  Exam reveals no gallop and no friction rub.   No murmur heard. Respiratory: Effort normal. No stridor. No respiratory distress. She has wheezes. She has no rales. She exhibits no tenderness.  GI: Soft. Bowel sounds are normal. She exhibits no  distension and no mass. There is no tenderness. There is no rebound and no guarding.  Musculoskeletal: She exhibits tenderness. She exhibits no edema.  Lymphadenopathy:    She has no cervical adenopathy.  Neurological: She displays abnormal reflex. She exhibits normal muscle tone. Coordination abnormal.  Skin: Skin is warm and dry. No rash noted. She is not diaphoretic. No erythema. No pallor.  Psychiatric: She has a normal mood and affect. Her behavior is normal. Judgment and thought content normal.    ORTHOPAEDIC EXAMINATION:  She has negative sciatic stretch signs.  Reflexes are zero and symmetric in the ankle, 2+ and symmetric at the knee.  Her problem is primarily that of pattern of neurogenic claudication.     Assessment: Lumbar stenosis L3-4 and L4-5.  Degenerative Spondylolisthesis, grade 1 L4-5 Retained hardware L4-5, interspinous distraction device.  PLAN:  She also has radiographic signs of very mild degenerative changes of the hip joints.  Anterolisthesis of L4 and L5 is evident, which is grade 1 with severe lumbar spinal stenosis evident at L4-5.  She has had previous metallic spacer placed posteriorly at the L4-5 level.  Unfortunately, her spinal canal is persistently compromised and she is experiencing increased discomfort.  Her studies also demonstrated L3-4 area stenosis.  My recommendations have been for a decompression at both L3-4, L4-5 with fusion of the L4-5 level for grade 1, nearly grade 2 spondylolisthesis at this segment.  In reviewing her chart, her last MRI scan is well over 3/4 of a year ago so that a more recent MRI scan was ordered and the results of which are available for review with [facility].  The study demonstrates a spondylolisthesis that is grade 2 at L4-5 about 10 mm on the sagittal viewing.  There is severe lumbar spinal stenosis findings associated with the L4-5 level and this is nearly critical at this point due to severe narrowing.  She has narrowing effecting  both the L4 and L5 nerve roots.  Posterior elements appear intact here.  At L5-S1, there is some central protrusion, subarticular foraminal zones appear narrowed bilaterally that could potentially effect her L5 and S1 nerve roots, but it is mild.  At L3-4, she has mild central canal stenosis and moderate ligamentum hypertrophy causing  bilateral subarticular zone narrowing and foraminal narrowing here that could effect the L3 and L4 nerve roots.  In reviewing the studies, again the most significant findings are at L3-4 and L4-5 so that I believe L5-S1 is not the source of her discomfort.  Pain pattern is more in keeping with discomfort into her thighs and weakness associated with her thighs and knees.  This would be more in keeping with upper lumbar pattern of stenosis compared with lower lumbar pattern.  Based on her most recent study, I would recommend for a decompression of L3-4 and L4-5.  Will go ahead and schedule her for this procedure, also plan a TLIF with posterolateral fusion at L4-5 using pedicle screws and rods and cage.  The risks of surgery including risk of infection, bleeding, neurologic compromise discussed with the patient.  Surgical time expected to be about 3-1/2 to 4-1/2 hours.  Risks include risks of bleeding, risks of nerves, risks of infection.  These were discussed with the patient.  Expected relief of discomfort would be significant. I expect improvement in her standing and walking tolerance.  I think the success rate following procedure of this type considering her degree of neurogenic claudication and the findings on her study to be nearly in the range of 95 to 97% success with decompression, although one cannot necessarily expect total nerve function return concerning any sensory or coordination loss that may have occurred. OWENS,JAMES M 05/25/2015, 9:43 AM

## 2015-05-25 NOTE — Op Note (Signed)
05/25/2015  4:57 PM  PATIENT:  Caroline Ware  81 y.o. female  MRN: [id number]  OPERATIVE REPORT  PRE-OPERATIVE DIAGNOSIS:  Lumbar spinal stenosis L3-4, L4-5, grade II spondylolisthesis L4-5  POST-OPERATIVE DIAGNOSIS:  Lumbar spinal stenosis L3-4, L4-5, grade II spondylolisthesis L4-5  PROCEDURE:  Procedure(s): LUMBAR DECOMPRESSIVE LAMINECTOMY L3-4 AND L4-5, TRANSFORAMINAL LUMBAR INTERBODY FUSION L4-5    SURGEON:  Jessy Oto, MD     ASSISTANT:  Benjiman Core, PA-C  (Present throughout the entire procedure and necessary for completion of procedure in a timely manner)     ANESTHESIA:  General, supplemented with local anesthesia marcaine 1/2% 1:1 exparel 1.3% total 40 cc. Dr. Yates Decamp. Conrad Monterey.    COMPLICATIONS:  None.   DRAINS: Foley to SD.  EBL: 450 CC    COMPONENTS:  Implant Name Type Inv. Item Serial No. Manufacturer Lot No. LRB No. Used  BONE MATRIX VIVIGEN 1CC - [id number] Bone Implant BONE MATRIX VIVIGEN 1CC [id number] [facility]  N/A 1  BONE MATRIX VIVIGEN 1CC - [id number] Bone Implant BONE MATRIX VIVIGEN 1CC [id number] [facility]  N/A 1  BONE CANC CHIPS 20CC - [id number] Bone Implant BONE CANC CHIPS 20CC [id number] [facility]  N/A 1  CAGE CONCORDE BULLET 9X11X23MM - [id number] Cage CAGE CONCORDE BULLET 9X11X23MM  DEPUY SPINE  N/A 1  SCREW VIPER CORTICAL FIX 6X40 - [id number] Screw SCREW VIPER CORTICAL FIX 6X40  DEPUY SPINE  N/A 2  SCREW VIPER CORT FIX 4.35X21 - [id number] Screw SCREW VIPER CORT FIX 4.35X21  DEPUY SPINE  N/A 1  SCREW VIPER CORT FIX 6.00X30 - [id number] Screw SCREW VIPER CORT FIX 6.00X30  DEPUY SPINE  N/A 1  SCREW SET SINGLE INNER - [id number] Screw SCREW SET SINGLE INNER  DEPUY SPINE  N/A 4  ROD PRE BENT EXPEDIUM 35MM - [id number] Rod ROD PRE BENT EXPEDIUM 35MM   DEPUY SPINE   N/A 2     PROCEDURE: The patient was met in the holding area, and the appropriate lumbar levels left  L3-4 and  L4-5 identified and marked with an "X" and my initials. I had discussion with the patient in the preop holding area regarding a change of consent form.The fusion level was reidentified as  L4-5. Patient understands the rationale to perform TLIF at L4-5 level to decompress the L3-4 and L4-5 spinal stenosis and fuse the level of spondylolisthesis L4-5. The patient was then transported to OR and was placed under general anestheticwithout difficulty. The patient received appropriate preoperative antibiotic prophylaxis vancomycin 1 gm IV for multiple antibiotic allergies.  Nursing staff inserted a Foley catheter under sterile conditions. The patient was then turned to a prone position using the [place] spine frame. PAS. all pressure points well padded the arms at the side to 90 90. Standard prep with Chlorohexidine solution for iodine allery draped in the usual manner from the lower dorsal spine the mid sacral segment. Plain Vi-Drape was used and the old incision scar was marked. Time-out procedure was called and correct. Skin in the midline between L2 and L5 was then infiltrated with local anesthesia, marcaine 1/2% 1:1 exparel 1.3% total 20 cc used. Incision was then made  extending from L2-L5  through the skin and subcutaneous layers excising the the old incision scar and continuing down to the patient's lumbodorsal fascia and spinous processes. The incision then carried sharply excising the supraspinous ligament and then continuing the lateral aspect of the spinous processes of L3, L4 and L5. Cobb elevator  used to carefully elevate the paralumbar muscles off of the posterior elements using electrocautery carefully drilled bleeding and perform dissection of the muscle tissues of the preserving the facet capsule at the L3-4. Continuing the exposure out laterally to expose the lateral margin of the facet joint line at L3-4 and L4-5. Incision was carried in the midline down to the L5 level area bleeders controlled  using electrocautery monopolar electrocautery.  Retained hardware at the  L5-S1 interspinous process level observed beneath the L5 spinous process. Spinous processes of L4 and L3 marked with  OR marking pen sterilely,VIper self retaining retractor was used for the incision. C-arm fluoroscopy was then brought into the field and using C-arm fluoroscopy then a hole made into the medial aspect of the pedicle of left L5 using a high speed burr observed in the pedicle using C arm at the 5 oclock position on the left L5 pedicle nerve probe initial entry was determined on fluoroscopy to be good position alignment so that a 4.41m tap was passed to 30 mm within the left L5 pedicle to a depth of nearly 35 mm observed on C-arm fluoroscopy to be beyond the midpoint of the lumbar vertebra and then position alignment within the left L3 pedicle this was then removed and the pedicle channel probed demonstrating lateral rupture the cortex of the pedicle so that a 30 mm length screw was chosen. Tapping with a 4 mm screw tap then 5 mm tap then a 6 mm tap a 6.0 mm x 30 mm screw was reserved for later placement on the left side pedicle at the L5 level. C-arm fluoroscopy was then brought into the field and using C-arm fluoroscopy then a hole made into the posterior medial aspect of the pedicle of right L5 observed in the pedicle using ball tipped nerve hook and hockey stick nerve probe initial entry was determined on fluoroscopy to be good position alignment so that 4.089mtap was then used to tap the right L5 pedicle to a depth of nearly 35 mm observed on C-arm fluoroscopy to be at the midpoint of the lumbar vertebra and then position alignment within the right L5 pedicle this was then removed and the pedicle channel probed demonstrating patency no sign of rupture the cortex of the pedicle. Tapping with a 5 mm screw tap then tapping with a 6.0 mm tap, a 6.0 mm x 35 mm screw was saved for later placement after decompression and TLIF.  C-arm fluoroscopy was then brought into the field and using C-arm fluoroscopy then a hole made into the posterior and medial aspect of the left pedicle of L4 observed in the pedicle using ball tipped nerve hook and hockey stick nerve probe initial entry was determined on fluoroscopy to be good position alignment so that a 4.0 mm tap was then used to tap the left L4 pedicle to a depth of nearly 40 mm observed on C-arm fluoroscopy to be beyond the posterior one third of the lumbar vertebra and good position alignment within the left L4 pedicle this was then removed and the pedicle channel probed demonstrating patency no sign of rupture the cortex of the pedicle. Tapping with a 4.0 mm screw tap then a 5.0 mm tap then tapping with a 6.0 mm tap a 6.8m85m 40 mm screw was saved for later placement on the left side at the L4 level. The pedicle channel of L4 on the left probed demonstrating patency no sign of rupture the cortex of the pedicle.C-arm fluoroscopy was  then brought into the field and using C-arm fluoroscopy then a hole made into the posterior and medial aspect of the right pedicle of L4 observed in the pedicle using ball tipped nerve hook and hockey stick nerve probe initial entry was determined on fluoroscopy to be good position alignment so that a 4.64mm tap was then used to tap the right L4 pedicle to a depth of nearly 40 mm observed on C-arm fluoroscopy to be beyond the posterior one third of the lumbar vertebra and good position alignment within the right L4 pedicle this was then removed and the pedicle channel probed demonstrating patency no sign of rupture the cortex of the pedicle. Tapping with a 5 mm screw then 5.50mm x 30 mm screw was placed on the right side at the L5 level. The pedicle channel of L4 on the right probed demonstrating patency no sign of rupture the cortex of the pedicle. Viper screw for fixation of this level was measured as 6.0 mm x 40 mm screw reserved for insertion after decompression  and TLIF.  Flow Seal was used for hemostasis of the bilateral L4 and L5 pedicle screw holes.  Spinous processes of  L4 and inferior 30% of L3 were then resected down to the base the lamina at each segment.  Leksell rongeur used to resect inferior aspect of the lamina on the left side at the L3 level and partially on the right side at L3 The left medial 40% of the facets of L4-5 were resected in order to decompress the left and right side of the lumbar thecal sac at L4-5 and decompress the bilateral  L4 and L5 neuroforamen. Osteotomes and 31mm and 88mm kerrisons were used for this portion of the decompression. Similarly the left side decompression was carried out but  Near complete facetectomy was perform on the left at L4-5 to provide for exposure of the left side L4-5 neuroforamen for ease of placement of TLIF (transforaminal lumbar interbody fusion) at the L4 level inferior portions of the lamina and pars were also resected first beginning with the Leksell rongeur and osteotomes and then resecting using 2 and 3 mm Kerrison. Continued laminectomy was carried out resecting the central portions of the lamina of L4 and upper L5 performing foraminotomies on the right side at the L4 and L5 levels. The inferior articular process  L4 was resected on the left side.  A large amount of hypertrophic ligmentum flavum was found impressing on the left lateral recesses at L4-5 and narrowing the respective L4 and L5 neuroforamen.  Loupe magnification and headlight were used during this portion procedure.There was severe encroachment on the lateral recesses and high speed bur was used to thin the cental portions of the L4 laminae. OR microscope was sterilely draped and brought into the field. Under the OR microscope the quarter inch osteotome was used to osteotomize the medial 30 % of both L4-5 facets. Pituitary ronguers and 2 mm kerrison used to remove the osteotomized bone and ligamentum flavum off the L4 and L5 nerve roots  decompressing the lateral recesses and the L4 and L5 neurof.The laminectomy was carried up to the inferior L3-4 level with recession of the medial hypertrophic ligamentum flavum bilaterally using 2 and 3 mm kerrisonc.  Attention then turned to placement of the transforaminal lumbar interbody fusion cage.  Bleeding controlled using bipolar electrocautery thrombin soaked gel cottonoids. Then turned to the left L4-5 level the exposure the posterior lateral aspect this was carried out using a [place] 4 bipolar  electrocautery to control small bleeders present. Derricho retractor used to retract the thecal sac and L4 nerve root a 15 blade scalpel was used to incise posterior lateral aspect of the left L4-5 disc the disc space at this level showed a rather protrusion posteriorly and this was resected with pituitary ronguer and eased access into the L4-5 disc space. The space was debrided of degenerative disc material using pituitary along the entire disc space was then debrided of degenerative disc material using pituitary rongeurs curettage down to bleeding bone endplates. 71m and 8 mm shavers were used to debride the disc space and pituitary ronguers used to remove the loosened debris. This space was then carefully assess using spacers  a 11.058mtrial cage provided the best fit, the Depuy concorde bullet lordotic cage 1142m 19m68ms chosen so that the permanent 11.0 mm cage by 23 mm Lordotic concorde cage packed with local bone graft was placed into the intervertebral disc space. The posterior intervertebral disc space was then packed with autogenous local bone that been harvested from the central laminectomy, vivigen and cancellous allograft chips. Bleeding controlled using bipolar electrocautery.  Observed on C-arm fluoroscopy to be in good position alignment. The cage at L4-5 was placed anteriorly as best as possible the correct patient's lordosis. With this then the transforaminal lumbar interbody fusion portion of  the case was completed bleeders were controlled using bipolar electrocautery thrombin-soaked Gelfoam were appropriate.Decortication of the facet joints carried out bilateral L4-5. These were packed with cancellous allograft chips and local bone graft.  The 2 viper corticofixation screws at the L5 level were each placed and then each fastener carefully aligned  to allow for placement of rods. The the screws then placed at the L4 level.The left side first quarter inch titanium rod was then carefully contoured using the french benders and a precontoured 35 mm rod. This was then placed into the pedicle screws on the left extending from L4-5 each of the caps were carefully placed loosely tightened. Attention turned to the right side were similarly screws were carefully adjusted to allow for a better pattern screws to allow for placement of fixation of the rod a quarter inch 35 mm precontoured titanium rod was then carefully contoured. This was able to be inserted into the right pedicle screw fasteners, Caps onto the L4 fasteners were tightened to 80 foot lbs. Across the left side  L4-5 screw fasteners compression was obtained on the between L4 and L5 compressing between the fasteners and tightening the screw caps 85 pounds. Similarly this was done on the right side at L4-5 obtaining compression and tightened 85 pounds. Irrigation was carried out with copious amounts of saline solution this was done throughout the case. Cell Saver was used during the case. Hockey stick neuroprobe was used to probe the neuroforamen bilateral L4 and L5 these were determined to be well decompressed. Permanent C-arm images were obtained in AP and lateral plane and oblique planes. Remaining local bone graft was then applied along both lateral posterior lateral region extending from L4 to L5 facet beds.Gelfoam was then removed spinal canal. The lumbodorsal musculature carefully exam debrided of any devitalized tissue following removal of self  retaining retractors were the bleeders were controlled using electrocautery and the area dorsal lumbar muscle were then approximated in the midline with interrupted #1 Vicryl sutures loose the dorsal fascia was reattached to the spinous process of L2-3  superiorly and L5  inferiorly this was done with #1 Vicryl sutures. Subcutaneous layers then  approximated using interrupted 0 Vicryl sutures and 2-0 Vicryl sutures. Skin was closed with a running subcutaneous stitch of 4-0 Vicryl Dermabond was applied then MedPlex bandage. All instrument and sponge counts were correct. The patient was then returned to a supine position on her bed reactivated extubated and returned to the recovery room in satisfactory condition.   Benjiman Core, PA-C perform the duties of assistant surgeon during this case. He was present from the beginning of the case to the end of the case assisting in transfer the patient from his stretcher to the OR table and back to the stretcher at the end of the case. Assisted in careful retraction and suction of the laminectomy site delicate neural structures operating under the operating room microscope. He performed closure of the incision from the fascia to the skin applying the dressing.         Hanna Aultman E  05/25/2015, 4:57 PM

## 2015-05-25 NOTE — Anesthesia Procedure Notes (Signed)
Procedure Name: Intubation Date/Time: 05/25/2015 10:16 AM Performed by: Fransisca Kaufmann Pre-anesthesia Checklist: Patient identified, Emergency Drugs available, Suction available, Patient being monitored and Timeout performed Patient Re-evaluated:Patient Re-evaluated prior to inductionOxygen Delivery Method: Circle system utilized Preoxygenation: Pre-oxygenation with 100% oxygen Intubation Type: IV induction Ventilation: Mask ventilation without difficulty Laryngoscope Size: Mac and 3 Grade View: Grade I Tube type: Oral Tube size: 7.5 mm Number of attempts: 1 Airway Equipment and Method: Stylet Placement Confirmation: ETT inserted through vocal cords under direct vision,  positive ETCO2 and breath sounds checked- equal and bilateral Secured at: 22 cm Tube secured with: Tape Dental Injury: Teeth and Oropharynx as per pre-operative assessment

## 2015-05-26 ENCOUNTER — Encounter (HOSPITAL_COMMUNITY): Payer: Self-pay | Admitting: Specialist

## 2015-05-26 LAB — CBC
HCT: 26.1 % — ABNORMAL LOW (ref 36.0–46.0)
HEMOGLOBIN: 8.2 g/dL — AB (ref 12.0–15.0)
MCH: 29.1 pg (ref 26.0–34.0)
MCHC: 31.4 g/dL (ref 30.0–36.0)
MCV: 92.6 fL (ref 78.0–100.0)
Platelets: 182 10*3/uL (ref 150–400)
RBC: 2.82 MIL/uL — ABNORMAL LOW (ref 3.87–5.11)
RDW: 13.6 % (ref 11.5–15.5)
WBC: 6.2 10*3/uL (ref 4.0–10.5)

## 2015-05-26 LAB — POCT I-STAT 4, (NA,K, GLUC, HGB,HCT)
Glucose, Bld: 91 mg/dL (ref 65–99)
Glucose, Bld: 92 mg/dL (ref 65–99)
HCT: 25 % — ABNORMAL LOW (ref 36.0–46.0)
HCT: 26 % — ABNORMAL LOW (ref 36.0–46.0)
HEMOGLOBIN: 8.5 g/dL — AB (ref 12.0–15.0)
HEMOGLOBIN: 8.8 g/dL — AB (ref 12.0–15.0)
Potassium: 3.8 mmol/L (ref 3.5–5.1)
Potassium: 3.4 mmol/L — ABNORMAL LOW (ref 3.5–5.1)
SODIUM: 142 mmol/L (ref 135–145)
Sodium: 143 mmol/L (ref 135–145)

## 2015-05-26 LAB — BASIC METABOLIC PANEL
Anion gap: 7 (ref 5–15)
BUN: 13 mg/dL (ref 6–20)
CALCIUM: 8.1 mg/dL — AB (ref 8.9–10.3)
CO2: 23 mmol/L (ref 22–32)
CREATININE: 1.14 mg/dL — AB (ref 0.44–1.00)
Chloride: 110 mmol/L (ref 101–111)
GFR calc non Af Amer: 44 mL/min — ABNORMAL LOW (ref >60)
GFR, EST AFRICAN AMERICAN: 51 mL/min — AB (ref >60)
GLUCOSE: 126 mg/dL — AB (ref 65–99)
Potassium: 4 mmol/L (ref 3.5–5.1)
Sodium: 140 mmol/L (ref 135–145)

## 2015-05-26 MED ORDER — CALCIUM CARBONATE 1250 (500 CA) MG PO TABS
1.0000 | ORAL_TABLET | Freq: Two times a day (BID) | ORAL | Status: DC
  Administered 2015-05-26 – 2015-05-28 (×5): 500 mg via ORAL
  Filled 2015-05-26 (×5): qty 1

## 2015-05-26 MED FILL — Heparin Sodium (Porcine) Inj 1000 Unit/ML: INTRAMUSCULAR | Qty: 30 | Status: AC

## 2015-05-26 MED FILL — Thrombin For Soln 20000 Unit: CUTANEOUS | Qty: 1 | Status: AC

## 2015-05-26 MED FILL — Sodium Chloride IV Soln 0.9%: INTRAVENOUS | Qty: 2000 | Status: AC

## 2015-05-26 NOTE — Progress Notes (Signed)
PT Cancellation Note  Patient Details Name: Mychala Devoid MRN: [id number] DOB: February 03, 1933   Cancelled Treatment:    Reason Eval/Treat Not Completed: Other (comment) Still awaiting Spinal Brace as requested yesterday. Will continue to await arrival of Brace prior to mobilization.   Fabio Asa 05/26/2015, 7:44 AM Charlotte Crumb, PT DPT  [phone]

## 2015-05-26 NOTE — Progress Notes (Signed)
Patient ID: Tanah Drysdale, female   DOB: 1933-04-23, 81 y.o.   MRN: [id number]     Subjective: 1 Day Post-Op Procedure(s) (LRB): LUMBAR DECOMPRESSIVE LAMINECTOMY L3-4 AND L4-5, TRANSFORAMINAL LUMBAR INTERBODY FUSION L4-5 (N/A) Awake, alert and oriented x 4. On bed pan this morning, no BM, + flatus. Abdomen distended. Left leg with weakness, left thigh pain. Preop pain left leg is resolved.Back hurts terribly. Patient reports pain as moderate.    Objective:   VITALS:  Temp:  [97.4 F (36.3 C)-102.7 F (39.3 C)] 100.2 F (37.9 C) (06/15 0954) Pulse Rate:  [45-84] 77 (06/15 1116) Resp:  [11-18] 18 (06/15 0954) BP: (93-167)/(33-62) 93/41 mmHg (06/15 1116) SpO2:  [94 %-100 %] 94 % (06/15 0954)  ABD soft Sensation intact distally Intact pulses distally Dorsiflexion/Plantar flexion intact Incision: dressing C/D/I Weak left knee extension, 3/5, weak left hip flexor 3/5. Tender left thigh. Abdomenal distension, decreased BS, nontender.  LABS  Recent Labs  05/25/15 1409 05/25/15 1525 05/26/15 0411  HGB 8.5* 8.8* 8.2*  WBC  --   --  6.2  PLT  --   --  182    Recent Labs  05/25/15 1525 05/26/15 0411  NA 143 140  K 3.4* 4.0  CL  --  110  CO2  --  23  BUN  --  13  CREATININE  --  1.14*  GLUCOSE 92 126*   No results for input(s): LABPT, INR in the last 72 hours.   Assessment/Plan: 1 Day Post-Op Procedure(s) (LRB): LUMBAR DECOMPRESSIVE LAMINECTOMY L3-4 AND L4-5, TRANSFORAMINAL LUMBAR INTERBODY FUSION L4-5 (N/A) Weakness left knee extension may be due to L4 residual vs left anterior thigh support pressure causing anterior thigh and muscle pain from prolong Pressure 4 1/2 hour. Anemia, acute on chronic anemia.  Low BP relative to history of hypertension, hold antihypertensive agents, SBP<115 or DBP <60 Will be looking to SNF short term placement post hospitalization.  Advance diet when bowel function returns, may need liquids for now, due to abdomenal distension. Up  with therapy with brace, as tolerated, may need left knee imobilizer if the left knee extension causes tendency to give away. Discharge to SNF likely on Friday. Start irone Briellah Baik E 05/26/2015, 12:46 PM

## 2015-05-26 NOTE — Progress Notes (Signed)
Occupational Therapy Treatment Patient Details Name: Caroline Ware MRN: [id number] DOB: [birth year] Today's Date: 05/26/2015    History of present illness Patient is an 81 yo female s/p LUMBAR DECOMPRESSIVE LAMINECTOMY L3-4 AND L4-5, TRANSFORAMINAL LUMBAR INTERBODY FUSION L4-5.   OT comments  This therapist assisted patient back to bed after lunch. Assisted patient from recliner>bed. Patient stood using RW with mod assist (+2 needed for safety). Patient performed stand-pivot transfer with mod assist. Mod assist needed for management of BLEs for sit>supine and rolling onto back. Max verbal cues needed for patient to adhere to back precautions. Positioned patient with pillow under knees for back support.    Follow Up Recommendations  SNF;Supervision/Assistance - 24 hour    Equipment Recommendations  Other (comment) (TBD next venue of care)    Recommendations for Other Services  None at this time    Precautions / Restrictions Precautions Precautions: Back Precaution Booklet Issued: Yes (comment) Precaution Comments: verbally reviewed and provided hand out to patient Required Braces or Orthoses: Spinal Brace Spinal Brace: Lumbar corset Restrictions Weight Bearing Restrictions: No       Mobility Bed Mobility Overal bed mobility: Needs Assistance Bed Mobility: Rolling;Sit to Supine Rolling: Mod assist Sidelying to sit: Mod assist;+2 for physical assistance   Sit to supine: Mod assist Sit to sidelying: Mod assist;+2 for physical assistance General bed mobility comments: Assistance needed for management of BLEs into bed. Max verbal cueing to adhere to back precautions during sit>supine transfer and rolling.   Transfers Overall transfer level: Needs assistance Equipment used: Rolling walker (2 wheeled) Transfers: Sit to/from [employer] Sit to Stand: Mod assist;+2 physical assistance Stand pivot transfers: Mod assist;+2 physical assistance       General  transfer comment: VCs for hand placement and assistance needed to power up into standing.     Balance Overall balance assessment: Needs assistance Sitting-balance support: No upper extremity supported;Feet supported Sitting balance-Leahy Scale: Poor Sitting balance - Comments: required assist to maintain sitting balance Postural control: Posterior lean Standing balance support: Bilateral upper extremity supported;During functional activity Standing balance-Leahy Scale: Poor    Cognition   Behavior During Therapy: Anxious Overall Cognitive Status: Within Functional Limits for tasks assessed    Memory: Decreased recall of precautions     Extremity/Trunk Assessment  Upper Extremity Assessment Upper Extremity Assessment: Generalized weakness   Lower Extremity Assessment Lower Extremity Assessment: Generalized weakness   Cervical / Trunk Assessment Cervical / Trunk Assessment:  (increased body habitus)               Pertinent Vitals/ Pain       Pain Assessment: 0-10 Pain Score: 9  Pain Location: back Pain Descriptors / Indicators: Aching;Discomfort;Grimacing;Guarding;Sore Pain Intervention(s): Premedicated before session;Limited activity within patient's tolerance;Monitored during session;Repositioned  Home Living Family/patient expects to be discharged to:: Skilled nursing facility Living Arrangements: Alone Additional Comments: has RW at home    Prior Functioning/Environment Level of Independence: Independent        Comments: daughter states patient was independent but when her back would hurt she would take a pain pill and go to bed   Frequency Min 2X/week     Progress Toward Goals  OT Goals(current goals can now be found in the care plan section)  Progress towards OT goals: Progressing toward goals  Acute Rehab OT Goals Patient Stated Goal: go to rehab OT Goal Formulation: With patient/family Time For Goal Achievement: 06/02/15 Potential to Achieve  Goals: Good ADL Goals Pt Will Perform Grooming: standing;with min  guard assist Pt Will Perform Lower Body Bathing: with mod assist;sit to/from stand;with adaptive equipment Pt Will Perform Lower Body Dressing: with mod assist;sit to/from stand;with adaptive equipment Pt Will Transfer to Toilet: with min assist;ambulating;bedside commode Pt Will Perform Toileting - Clothing Manipulation and hygiene: with min assist;sit to/from stand;with adaptive equipment Additional ADL Goal #1: Patient will independently verbalize 3/3 back precautions 100% of the time Additional ADL Goal #2: Patient will be able to verbalize brace wearing orders independently 100% of the time  Plan Discharge plan remains appropriate    End of Session Equipment Utilized During Treatment: Gait belt;Rolling walker;Back brace   Activity Tolerance Patient tolerated treatment well   Patient Left with call bell/phone within reach;with family/visitor present;in bed;with bed alarm set  Nurse Communication Mobility status (need for +2 for transfer)        Time: 1208-1218 OT Time Calculation (min): 10 min  Charges: OT General Charges $OT Visit: 1 Procedure OT Evaluation $Initial OT Evaluation Tier I: 1 Procedure OT Treatments $Self Care/Home Management : 8-22 mins  Shilpa Bushee , MS, OTR/L, CLT Pager: [phone]  05/26/2015, 2:15 PM

## 2015-05-26 NOTE — Progress Notes (Signed)
Orthopedic Tech Progress Note Patient Details:  Caroline Ware 04-23-1933 [id number]  Patient ID: Aubery Lapping, female   DOB: 1933/05/02, 81 y.o.   MRN: [id number] Called in bio-tech brace order; spoke with Anderson Malta, Melanni Benway 05/26/2015, 9:23 AM

## 2015-05-26 NOTE — Clinical Social Work Note (Signed)
CSW Consult Acknowledged:   CSW received a consult for SNF placement. CSW awaiting PT/OT evaluation to determine the appropriate level of care.  Awaiting Spinal Brace.    Refugio Mcconico, MSW, LCSWA [phone]

## 2015-05-26 NOTE — Progress Notes (Addendum)
Occupational Therapy Evaluation Patient Details Name: Caroline Ware MRN: [id number] DOB: July 20, 1933 Today's Date: 05/26/2015    History of Present Illness Patient is an 81 yo female s/p LUMBAR DECOMPRESSIVE LAMINECTOMY L3-4 AND L4-5, TRANSFORAMINAL LUMBAR INTERBODY FUSION L4-5.   Clinical Impression   Patient presenting with decreased strength, endurance, and independence post back surgery. Patient independent PTA. Patient currently functioning at an overall mod>max level and +2 needed for safety during transfers. Patient will benefit from acute OT to increase overall independence in the areas of ADLs, functional mobility, education regarding back precautions, and overall safety in order to safely discharge to venue listed below. Upon leaving room, patient requested to eat lunch while seated in recliner, but also requested to get back to bed soon. Left patient to eat lunch and assisted patient back to bed afterwards. Please see next OT note for information regarding transfer and back to bed mobility.     Follow Up Recommendations  SNF;Supervision/Assistance - 24 hour    Equipment Recommendations  Other (comment) (TBD next venue of care)    Recommendations for Other Services  None at this time   Precautions / Restrictions Precautions Precautions: Back Precaution Booklet Issued: Yes (comment) Precaution Comments: verbally reviewed and provided hand out to patient Required Braces or Orthoses: Spinal Brace Spinal Brace: Lumbar corset Restrictions Weight Bearing Restrictions: No              ADL Overall ADL's : Needs assistance/impaired Eating/Feeding: Set up;Sitting   Grooming: Set up;Sitting   Upper Body Bathing: Minimal assitance;Sitting   Lower Body Bathing: Maximal assistance;Sit to/from stand;Cueing for safety   Upper Body Dressing : Minimal assistance;Sitting Upper Body Dressing Details (indicate cue type and reason): total assist for donning of brace Lower Body  Dressing: Maximal assistance;Sit to/from stand;Cueing for safety Toileting- Clothing Manipulation and Hygiene: Maximal assistance;Sit to/from stand;Cueing for safety General ADL Comments: Patient anxious and with poor carryover regarding back precautions. Educated patient on back precautions and importance of adhering to them throughout all tasks. [employer]red back precautions handout and AE handout. Patient's daughter present and encouraged her to bring in patient's AE and supportive shoes with back. No transfer occured during initial OT eval.     Pertinent Vitals/Pain Pain Assessment: 0-10 Pain Score: 9  Pain Location: back Pain Descriptors / Indicators: Aching;Discomfort;Grimacing;Guarding;Sore Pain Intervention(s): Premedicated before session;Limited activity within patient's tolerance;Monitored during session;Repositioned     Hand Dominance Right   Extremity/Trunk Assessment Upper Extremity Assessment Upper Extremity Assessment: Generalized weakness   Lower Extremity Assessment Lower Extremity Assessment: Generalized weakness   Cervical / Trunk Assessment Cervical / Trunk Assessment:  (increased body habitus)   Communication Communication Communication: HOH   Cognition Arousal/Alertness: Awake/alert Behavior During Therapy: Anxious Overall Cognitive Status: Within Functional Limits for tasks assessed       Memory: Decreased recall of precautions              Home Living Family/patient expects to be discharged to:: Skilled nursing facility Living Arrangements: Alone Additional Comments: has RW at home      Prior Functioning/Environment Level of Independence: Independent        Comments: daughter states patient was independent but when her back would hurt she would take a pain pill and go to bed    OT Diagnosis: Generalized weakness;Acute pain   OT Problem List: Decreased strength;Decreased range of motion;Decreased activity tolerance;Impaired balance  (sitting and/or standing);Decreased safety awareness;Decreased knowledge of use of DME or AE;Decreased knowledge of precautions;Pain   OT Treatment/Interventions: Self-care/ADL  training;Therapeutic exercise;Energy conservation;DME and/or AE instruction;Therapeutic activities;Patient/family education;Balance training    OT Goals(Current goals can be found in the care plan section) Acute Rehab OT Goals Patient Stated Goal: go to rehab OT Goal Formulation: With patient/family Time For Goal Achievement: 06/02/15 Potential to Achieve Goals: Good ADL Goals Pt Will Perform Grooming: standing;with min guard assist Pt Will Perform Lower Body Bathing: with mod assist;sit to/from stand;with adaptive equipment Pt Will Perform Lower Body Dressing: with mod assist;sit to/from stand;with adaptive equipment Pt Will Transfer to Toilet: with min assist;ambulating;bedside commode Pt Will Perform Toileting - Clothing Manipulation and hygiene: with min assist;sit to/from stand;with adaptive equipment Additional ADL Goal #1: Patient will independently verbalize 3/3 back precautions 100% of the time Additional ADL Goal #2: Patient will be able to verbalize brace wearing orders independently 100% of the time  OT Frequency: Min 2X/week   Barriers to D/C: Decreased caregiver support    End of Session Equipment Utilized During Treatment: Back brace  Activity Tolerance: Patient tolerated treatment well Patient left: with call bell/phone within reach;with family/visitor present;in chair;with chair alarm set   Time: 1610-9604  OT Time Calculation (min): 12 min Charges:  OT General Charges $OT Visit: 1 Procedure OT Evaluation $Initial OT Evaluation Tier I: 1 Procedure  Caroline Ware , MS, OTR/L, CLT Pager: [phone]  05/26/2015, 2:14 PM

## 2015-05-26 NOTE — Clinical Social Work Placement (Signed)
   CLINICAL SOCIAL WORK PLACEMENT  NOTE  Date:  05/26/2015  Patient Details  Name: Caroline Ware MRN: [id number] Date of Birth: 1933/05/28  Clinical Social Work is seeking post-discharge placement for this patient at the Skilled  Nursing Facility level of care (*CSW will initial, date and re-position this form in  chart as items are completed):  Yes   Patient/family provided with [facility] Clinical Social Work Department's list of facilities offering this level of care within the geographic area requested by the patient (or if unable, by the patient's family).  Yes   Patient/family informed of their freedom to choose among providers that offer the needed level of care, that participate in Medicare, Medicaid or managed care program needed by the patient, have an available bed and are willing to accept the patient.  Yes   Patient/family informed of 's ownership interest in [facility] and [facility], as well as of the fact that they are under no obligation to receive care at these facilities.  PASRR submitted to EDS on       PASRR number received on       Existing PASRR number confirmed on       FL2 transmitted to all facilities in geographic area requested by pt/family on 05/26/15     FL2 transmitted to all facilities within larger geographic area on       Patient informed that his/her managed care company has contracts with or will negotiate with certain facilities, including the following:            Patient/family informed of bed offers received.  Patient chooses bed at       Physician recommends and patient chooses bed at      Patient to be transferred to   on  .  Patient to be transferred to facility by       Patient family notified on   of transfer.  Name of family member notified:        PHYSICIAN       Additional Comment:    _______________________________________________ Gwynne Edinger, LCSW 05/26/2015, 3:04 PM

## 2015-05-26 NOTE — Evaluation (Signed)
Physical Therapy Evaluation Patient Details Name: Caroline Ware MRN: [id number] DOB: [birth year] Today's Date: 05/26/2015   History of Present Illness  Patient is an 81 yo female s/p LUMBAR DECOMPRESSIVE LAMINECTOMY L3-4 AND L4-5, TRANSFORAMINAL LUMBAR INTERBODY FUSION L4-5.  Clinical Impression  Patient demonstrates deficits in functional mobility as indicated below. Will need continued skilled PT to address deficits and maximize function. Will see as indicated and progress as tolerated.  Patient will need ST SNF upon acute discharge. OF NOTE: Patient very limited with mobility secondary to increased pain     Follow Up Recommendations SNF;Supervision/Assistance - 24 hour    Equipment Recommendations  None recommended by PT    Recommendations for Other Services       Precautions / Restrictions Precautions Precautions: Back Precaution Booklet Issued: Yes (comment) Precaution Comments: verbally reviewed and provided hand out to patient Required Braces or Orthoses: Spinal Brace Spinal Brace: Lumbar corset Restrictions Weight Bearing Restrictions: No      Mobility  Bed Mobility Overal bed mobility: Needs Assistance;+2 for physical assistance Bed Mobility: Rolling;Sidelying to Sit Rolling: Max assist Sidelying to sit: Max assist;+2 for physical assistance       General bed mobility comments: Increased time to perform, required use of bed pad to roll and +2 person assist to elevate to standing  Transfers Overall transfer level: Needs assistance Equipment used: 2 person hand held assist Transfers: Sit to/from [employer] Sit to Stand: Max assist;+2 physical assistance;From elevated surface Stand pivot transfers: Max assist;+2 physical assistance;From elevated surface       General transfer comment: Performed x2, significant pain during standing transition. patient with minimal effort initially, improved to +2 moderate assist by the 3rd trial from [facility] with  max cues for positioning and hand placement  Ambulation/Gait Ambulation/Gait assistance: Max assist Ambulation Distance (Feet): 3 Feet Assistive device: Rolling walker (2 wheeled)          Stairs            Wheelchair Mobility    Modified Rankin (Stroke Patients Only)       Balance Overall balance assessment: Needs assistance Sitting-balance support: Feet supported Sitting balance-Leahy Scale: Poor Sitting balance - Comments: required assist to maintain sitting balance Postural control: Posterior lean                                   Pertinent Vitals/Pain Pain Assessment: 0-10 Pain Score: 9  Pain Location: back  Pain Descriptors / Indicators: Aching;Discomfort;Grimacing;Guarding;Sore Pain Intervention(s): Premedicated before session;Repositioned;Relaxation;Limited activity within patient's tolerance;Monitored during session    Home Living Family/patient expects to be discharged to:: Skilled nursing facility Living Arrangements: Alone               Additional Comments: has RW at home    Prior Function Level of Independence: Independent         Comments: daughter states patient was independent but when her back would hurt she would take a pain pill and go to bed     Hand Dominance   Dominant Hand: Right    Extremity/Trunk Assessment   Upper Extremity Assessment: Generalized weakness           Lower Extremity Assessment: Generalized weakness      Cervical / Trunk Assessment:  (increased body habitus)  Communication   Communication: HOH  Cognition Arousal/Alertness: Awake/alert Behavior During Therapy: Anxious Overall Cognitive Status: Within Functional Limits for tasks assessed  General Comments      Exercises        Assessment/Plan    PT Assessment Patient needs continued PT services  PT Diagnosis Difficulty walking;Abnormality of gait;Generalized weakness;Acute pain   PT  Problem List Decreased strength;Decreased range of motion;Decreased activity tolerance;Decreased balance;Decreased mobility;Decreased cognition;Decreased knowledge of use of DME;Decreased safety awareness;Decreased knowledge of precautions;Obesity;Pain  PT Treatment Interventions DME instruction;Gait training;Functional mobility training;Therapeutic activities;Therapeutic exercise;Balance training;Patient/family education   PT Goals (Current goals can be found in the Care Plan section) Acute Rehab PT Goals Patient Stated Goal: to get rid of the pain PT Goal Formulation: With patient Time For Goal Achievement: 06/09/15 Potential to Achieve Goals: Good    Frequency Min 5X/week   Barriers to discharge Decreased caregiver support      Co-evaluation               End of Session Equipment Utilized During Treatment: Gait belt;Back brace Activity Tolerance: Patient limited by fatigue;Patient limited by pain Patient left: in chair;with call bell/phone within reach;with chair alarm set;with family/visitor present Nurse Communication: Mobility status         Time: 6010-9323 PT Time Calculation (min) (ACUTE ONLY): 26 min   Charges:   PT Evaluation $Initial PT Evaluation Tier I: 1 Procedure PT Treatments $Therapeutic Activity: 8-22 mins   PT G CodesFabio Asa Jun 09, 2015, 1:49 PM Charlotte Crumb, PT DPT  [phone]

## 2015-05-26 NOTE — Clinical Social Work Note (Signed)
Clinical Social Work Assessment  Patient Details  Name: Caroline Ware MRN: [id number] Date of Birth: 09/30/1933  Date of referral:  05/26/15               Reason for consult:  Facility Placement                Housing/Transportation Living arrangements for the past 2 months:  Single Family Home Source of Information:  Patient Patient Interpreter Needed:  None Criminal Activity/Legal Involvement Pertinent to Current Situation/Hospitalization:  No - Comment as needed Significant Relationships:  Adult Children Lives with:  Self Do you feel safe going back to the place where you live?  No Need for family participation in patient care:  No   Care giving concerns:  NA   Facilities manager / plan:  CSW met the pt at bedside. CSW introduced self and purpose of the visit. CSW discussed SNF rehab. CSW explained the SNF process. CSW explained insurance and its relation to SNF placement. CSW provided the pt with a SNF list. CSW and pt discussed geographic location in which the she would like to receive rehab. The pt reported that she will like to be placed in [facility] because her son lived there. CSW answered all questions in which the pt inquired about. CSW will continue to follow this pt and assist with discharge as needed.   Employment status:  Retired Forensic scientist:   Medicare PT Recommendations:  [place] / Referral to community resources:  SBIRT  Patient/Family's Response to care:  Pt reported that staff is nice.   Patient/Family's Understanding of and Emotional Response to Diagnosis, Current Treatment, and Prognosis:  Pt acknowledged her current diagnose. Pt reported being in pain. Pt reported that she is ready to move around more and not lay in the bed.   Emotional Assessment Appearance:  Appears stated age Attitude/Demeanor/Rapport:   (Calm ) Affect (typically observed):  Appropriate, Pleasant Orientation:  Oriented to Situation,  Oriented to  Time, Oriented to Place, Oriented to Self Alcohol / Substance use:  Not Applicable Psych involvement (Current and /or in the community):  No (Comment)  Discharge Needs  Concerns to be addressed:  Denies Needs/Concerns at this time Readmission within the last 30 days:  No Current discharge risk:  None Barriers to Discharge:  No Barriers Identified   Nikcole Eischeid, LCSW 05/26/2015, 3:01 PM

## 2015-05-27 ENCOUNTER — Inpatient Hospital Stay (HOSPITAL_COMMUNITY): Payer: Medicare Other

## 2015-05-27 DIAGNOSIS — N390 Urinary tract infection, site not specified: Secondary | ICD-10-CM | POA: Diagnosis not present

## 2015-05-27 DIAGNOSIS — J9811 Atelectasis: Secondary | ICD-10-CM | POA: Diagnosis not present

## 2015-05-27 DIAGNOSIS — D62 Acute posthemorrhagic anemia: Secondary | ICD-10-CM | POA: Diagnosis not present

## 2015-05-27 LAB — BASIC METABOLIC PANEL
Anion gap: 5 (ref 5–15)
BUN: 10 mg/dL (ref 6–20)
CO2: 23 mmol/L (ref 22–32)
CREATININE: 1.33 mg/dL — AB (ref 0.44–1.00)
Calcium: 7.9 mg/dL — ABNORMAL LOW (ref 8.9–10.3)
Chloride: 113 mmol/L — ABNORMAL HIGH (ref 101–111)
GFR calc Af Amer: 43 mL/min — ABNORMAL LOW (ref >60)
GFR, EST NON AFRICAN AMERICAN: 37 mL/min — AB (ref >60)
Glucose, Bld: 124 mg/dL — ABNORMAL HIGH (ref 65–99)
Potassium: 4 mmol/L (ref 3.5–5.1)
Sodium: 141 mmol/L (ref 135–145)

## 2015-05-27 LAB — CBC
HEMATOCRIT: 25.5 % — AB (ref 36.0–46.0)
Hemoglobin: 7.8 g/dL — ABNORMAL LOW (ref 12.0–15.0)
MCH: 28.8 pg (ref 26.0–34.0)
MCHC: 30.6 g/dL (ref 30.0–36.0)
MCV: 94.1 fL (ref 78.0–100.0)
Platelets: 158 10*3/uL (ref 150–400)
RBC: 2.71 MIL/uL — ABNORMAL LOW (ref 3.87–5.11)
RDW: 13.9 % (ref 11.5–15.5)
WBC: 9.5 10*3/uL (ref 4.0–10.5)

## 2015-05-27 LAB — CLOSTRIDIUM DIFFICILE BY PCR: Toxigenic C. Difficile by PCR: NEGATIVE

## 2015-05-27 LAB — URINE MICROSCOPIC-ADD ON

## 2015-05-27 LAB — URINALYSIS, ROUTINE W REFLEX MICROSCOPIC
Bilirubin Urine: NEGATIVE
Glucose, UA: NEGATIVE mg/dL
Ketones, ur: NEGATIVE mg/dL
NITRITE: NEGATIVE
Protein, ur: NEGATIVE mg/dL
SPECIFIC GRAVITY, URINE: 1.011 (ref 1.005–1.030)
Urobilinogen, UA: 1 mg/dL (ref 0.0–1.0)
pH: 5.5 (ref 5.0–8.0)

## 2015-05-27 LAB — PREPARE RBC (CROSSMATCH)

## 2015-05-27 MED ORDER — FUROSEMIDE 10 MG/ML IJ SOLN
20.0000 mg | Freq: Once | INTRAMUSCULAR | Status: AC
  Administered 2015-05-27: 20 mg via INTRAVENOUS
  Filled 2015-05-27: qty 4

## 2015-05-27 MED ORDER — DIPHENHYDRAMINE HCL 25 MG PO CAPS
25.0000 mg | ORAL_CAPSULE | Freq: Once | ORAL | Status: AC
  Administered 2015-05-27: 25 mg via ORAL
  Filled 2015-05-27: qty 1

## 2015-05-27 MED ORDER — SODIUM CHLORIDE 0.9 % IV SOLN
Freq: Once | INTRAVENOUS | Status: AC
  Administered 2015-05-27: 16:00:00 via INTRAVENOUS

## 2015-05-27 MED ORDER — ACETAMINOPHEN 325 MG PO TABS
650.0000 mg | ORAL_TABLET | Freq: Once | ORAL | Status: AC
  Administered 2015-05-27: 650 mg via ORAL
  Filled 2015-05-27: qty 2

## 2015-05-27 MED ORDER — ALBUTEROL SULFATE (2.5 MG/3ML) 0.083% IN NEBU
2.5000 mg | INHALATION_SOLUTION | Freq: Four times a day (QID) | RESPIRATORY_TRACT | Status: AC
  Administered 2015-05-27 (×3): 2.5 mg via RESPIRATORY_TRACT
  Filled 2015-05-27 (×3): qty 3

## 2015-05-27 MED ORDER — SULFAMETHOXAZOLE-TRIMETHOPRIM 800-160 MG PO TABS
1.0000 | ORAL_TABLET | Freq: Two times a day (BID) | ORAL | Status: DC
  Administered 2015-05-27 – 2015-05-28 (×2): 1 via ORAL
  Filled 2015-05-27 (×2): qty 1

## 2015-05-27 NOTE — Progress Notes (Signed)
Report to oncoming RN to start 2nd unit of blood. RN aware.  Sim Boast, RN

## 2015-05-27 NOTE — Progress Notes (Signed)
Physical Therapy Treatment Patient Details Name: Caroline Ware MRN: [id number] DOB: 02-Jan-1933 Today's Date: 05/27/2015    History of Present Illness Patient is an 81 yo female s/p LUMBAR DECOMPRESSIVE LAMINECTOMY L3-4 AND L4-5, TRANSFORAMINAL LUMBAR INTERBODY FUSION L4-5.    PT Comments    Patient seen for therapy OOB this morning. Patient received incontinent of liquid stool. Hygiene performed with patient assisting for bed mobility. Assisted to EOB for donning of brace, then assisted patient OOB for some in room ambulation. Tolerated well. Patient educated regarding precautions (poor recall from previous session) and cued for safety. Encouraged OOB frequently with staff and educated regarding the need to call for hygiene if incontinent. Patient verbalizes understanding. Will continue to see and progress as tolerated.   Follow Up Recommendations  SNF;Supervision/Assistance - 24 hour     Equipment Recommendations  None recommended by PT    Recommendations for Other Services       Precautions / Restrictions Precautions Precautions: Back Precaution Booklet Issued: Yes (comment) Precaution Comments: verbally reviewed and provided hand out to patient Required Braces or Orthoses: Spinal Brace Spinal Brace: Lumbar corset Restrictions Weight Bearing Restrictions: No    Mobility  Bed Mobility Overal bed mobility: Needs Assistance Bed Mobility: Rolling;Sit to Supine Rolling: Mod assist Sidelying to sit: Mod assist;+2 for physical assistance       General bed mobility comments: VCs for rolling technique (pt unable to bend LLE due to weakness) assisted with roll using bed pad x3 for hygiene purposes). Moderate assist of 2 to elevate trunk to EOB  Transfers Overall transfer level: Needs assistance Equipment used: Rolling walker (2 wheeled) Transfers: Sit to/from Stand Sit to Stand: Mod assist;+2 physical assistance         General transfer comment: VCs for hand placement,  assist with chuch pad to elevate to standing, manual assist and cues for hand placement  Ambulation/Gait Ambulation/Gait assistance: Min assist Ambulation Distance (Feet): 12 Feet Assistive device: Rolling walker (2 wheeled) Gait Pattern/deviations: Step-to pattern         Stairs            Wheelchair Mobility    Modified Rankin (Stroke Patients Only)       Balance     Sitting balance-Leahy Scale: Fair Sitting balance - Comments: some improvements in sitting balance this session   Standing balance support: Bilateral upper extremity supported Standing balance-Leahy Scale: Poor                      Cognition Arousal/Alertness: Awake/alert Behavior During Therapy: Flat affect Overall Cognitive Status: Within Functional Limits for tasks assessed       Memory: Decreased recall of precautions              Exercises      General Comments        Pertinent Vitals/Pain Pain Assessment: Faces Faces Pain Scale: Hurts little more Pain Location: back Pain Descriptors / Indicators: Grimacing;Guarding;Aching Pain Intervention(s): Limited activity within patient's tolerance;Monitored during session;Repositioned;Relaxation    Home Living                      Prior Function            PT Goals (current goals can now be found in the care plan section) Acute Rehab PT Goals Patient Stated Goal: go to rehab PT Goal Formulation: With patient Time For Goal Achievement: 06/09/15 Potential to Achieve Goals: Good Progress towards PT goals: Progressing toward goals  Frequency  Min 5X/week    PT Plan Current plan remains appropriate    Co-evaluation             End of Session Equipment Utilized During Treatment: Gait belt;Back brace Activity Tolerance: Patient limited by fatigue;Patient limited by pain Patient left: in chair;with call bell/phone within reach;with chair alarm set;with family/visitor present     Time: 2111-7356 PT  Time Calculation (min) (ACUTE ONLY): 26 min  Charges:  $Therapeutic Activity: 8-22 mins $Self Care/Home Management: 8-22                    G CodesFabio Asa 06/20/15, 8:25 AM Charlotte Crumb, PT DPT  [phone]

## 2015-05-27 NOTE — Progress Notes (Signed)
Patient ID: Luana Herzberger, female   DOB: Feb 01, 1933, 81 y.o.   MRN: [id number]     Subjective: 2 Days Post-Op Procedure(s) (LRB): LUMBAR DECOMPRESSIVE LAMINECTOMY L3-4 AND L4-5, TRANSFORAMINAL LUMBAR INTERBODY FUSION L4-5 (N/A) Low grade fever 100.9, given breathin treatments, albuterol. UA with 21-50 WBC/HPF rare epithelial cells, Moderate leukocytosis Patient reports pain as moderate.    Objective:   VITALS:  Temp:  [98.6 F (37 C)-103 F (39.4 C)] 98.6 F (37 C) (06/16 1835) Pulse Rate:  [78-90] 82 (06/16 1835) Resp:  [18-20] 20 (06/16 1835) BP: (89-128)/(23-52) 124/51 mmHg (06/16 1835) SpO2:  [91 %-98 %] 96 % (06/16 1835) Weight:  [81.647 kg (180 lb)] 81.647 kg (180 lb) (06/16 1300)  Neurologically intact ABD soft Sensation intact distally Dorsiflexion/Plantar flexion intact C/o Back pain and persistent left thigh pain. HGB 7.8 transfused 1 Unit PRBCs, and plan for total of 2 units.   LABS  Recent Labs  05/25/15 1409 05/25/15 1525 05/26/15 0411 05/27/15 1006  HGB 8.5* 8.8* 8.2* 7.8*  WBC  --   --  6.2 9.5  PLT  --   --  182 158    Recent Labs  05/26/15 0411 05/27/15 1006  NA 140 141  K 4.0 4.0  CL 110 113*  CO2 23 23  BUN 13 10  CREATININE 1.14* 1.33*  GLUCOSE 126* 124*   No results for input(s): LABPT, INR in the last 72 hours.   Assessment/Plan: 2 Days Post-Op Procedure(s) (LRB): LUMBAR DECOMPRESSIVE LAMINECTOMY L3-4 AND L4-5, TRANSFORAMINAL LUMBAR INTERBODY FUSION L4-5 (N/A) Anemia peri operative blood loss on chronic anemia. Urinary Tract Infection.   Advance diet Up with therapy D/C IV fluids Plan for discharge tomorrow if stable and decreased fever and CBC WBC. Start Septra for UTI, cultures pending but UA with 21-50 WBC pe HPF.  NITKA,JAMES E 05/27/2015, 8:20 PM

## 2015-05-27 NOTE — Plan of Care (Signed)
Problem: Consults Goal: Diagnosis - Spinal Surgery Outcome: Completed/Met Date Met:  05/27/15 Lumbar Laminectomy (Complex)

## 2015-05-27 NOTE — Clinical Documentation Improvement (Signed)
05/27/15 Prgr note.Marland KitchenMarland Kitchen"Periop acute on chronic anemia."... 05/25/15 Op Note..."EBL: 450 CC..." 05/25/15 Anesthesia Rec..." cell saver "... TX: mon H & H daily x2  Ref. Range 05/20/2015 16:00 05/25/2015 08:20 05/25/2015 14:09 05/25/2015 15:25 05/26/2015 04:11  Hemoglobin Latest Ref Range: 12.0-15.0 g/dL 63.3H  8.5 (L) 8.8 (L) 8.2 (L)  HCT Latest Ref Range: 36.0-46.0 % 33.5L  25.0 (L) 26.0 (L) 26.1 (L)  For accurate Dx specificity & severity can noted "Periop acute on chronic anemia." be further specified with type for con'd being mon'd, eval'd & tx'd. Thank you  Possible Clinical Conditions?   Expected Acute Blood Loss Anemia  Acute Blood Loss Anemia  Acute on chronic blood loss anemia  Chronic blood loss anemia  Precipitous drop in Hematocrit  Other Condition  Cannot Clinically Determine  Supporting Information: See above note  Thank You, Toribio Harbour, RN, BSN, CCDS Certified Clinical Documentation Specialist Pager: [phone] [place]: Health Information Management

## 2015-05-27 NOTE — Progress Notes (Signed)
Patient ID: Minna Behmer, female   DOB: 1933/04/22, 81 y.o.   MRN: [id number]     Subjective: 2 Days Post-Op Procedure(s) (LRB): LUMBAR DECOMPRESSIVE LAMINECTOMY L3-4 AND L4-5, TRANSFORAMINAL LUMBAR INTERBODY FUSION L4-5 (N/A) Awake, alert and oriented x 4. BM this AM, diarrhea stools. Fever to 103, port CXR is showing RLL atelectasis no definite signs of  Pneumonia. Voiding without difficulty. Patient reports pain as moderate.    Objective:   VITALS:  Temp:  [98.3 F (36.8 C)-103 F (39.4 C)] 100.8 F (38.2 C) (06/16 0624) Pulse Rate:  [74-90] 87 (06/16 0528) Resp:  [18] 18 (06/16 0528) BP: (93-117)/(31-52) 117/49 mmHg (06/16 0528) SpO2:  [94 %-98 %] 98 % (06/16 0528)  ABD soft Sensation intact distally Dorsiflexion/Plantar flexion intact Incision: no drainage No cellulitis present Weak left knee.3/5 and left hip flexion 3/5.   LABS  Recent Labs  05/25/15 1409 05/25/15 1525 05/26/15 0411  HGB 8.5* 8.8* 8.2*  WBC  --   --  6.2  PLT  --   --  182    Recent Labs  05/25/15 1525 05/26/15 0411  NA 143 140  K 3.4* 4.0  CL  --  110  CO2  --  23  BUN  --  13  CREATININE  --  1.14*  GLUCOSE 92 126*   No results for input(s): LABPT, INR in the last 72 hours.   Assessment/Plan: 2 Days Post-Op Procedure(s) (LRB): LUMBAR DECOMPRESSIVE LAMINECTOMY L3-4 AND L4-5, TRANSFORAMINAL LUMBAR INTERBODY FUSION L4-5 (N/A)  Periop acute on chronic anemia. Fever, likely atelectasis, poor IS effort.  Advance diet may increase po intake. Up with therapy Plan for discharge tomorrow Discharge to SNF likely tomorrow, needs w/u of fever, will check lab.CBC and BMET. Send stool for C.dificile toxin.  NITKA,JAMES E 05/27/2015, 8:12 AM

## 2015-05-27 NOTE — Clinical Social Work Note (Signed)
The pt has accepted a bed at [facility]. When the pt is medically stable she can transition to [facility].   Wai Litt, MSW, LCSWA [phone]

## 2015-05-27 NOTE — Progress Notes (Signed)
Called MD regarding temp that has not gone away with Tylenol. Will continue to monitor. Suzy Bouchard E, [place] 05/27/2015 [phone]

## 2015-05-27 NOTE — Progress Notes (Signed)
Stool sample sent, result return negative for c.diff. Precautions discontinued. Urine sample obtained. Consent signed for blood trnx. Transfusion reactions reviewed patient/family with teach back. ICS encourage. Will continue to monitor.    Sim Boast, RN

## 2015-05-27 NOTE — Progress Notes (Addendum)
Patient's oral temp of 100.9. Tylenol 650mg  given prior to blood transfusion. Patient appears more lethargic than this AM. ICS encouraged. She verbalized that she "just wanted to take a nap". MD called for further advise of care. Awaiting on call back.    Dr. Ophelia Charter called back with order for urine culture. Lab able to retained urine and sent for culture. Oral abx ordered per MD. RN continue to encourage ICS. Will continue to monitor.   Sim Boast, RN

## 2015-05-27 NOTE — Progress Notes (Signed)
MD called back and ordered CXR and UA. Will continue to monitor patient's status.  Melina Schools, [place] 05/27/2015 6:54 AM

## 2015-05-27 NOTE — Progress Notes (Signed)
Patient ID: Caroline Ware, female   DOB: Jun 19, 1933, 81 y.o.   MRN: [id number] Lab  Today shows Hgb 7.8, she is showing hypotension with systolic BP 86. Creatinine is 1.33 similar to preop status. KCL:4.0 Na 140  Will transfuse 2 units of RBCs to stabilze her BP. CXR neg for pneumonia. UA is pending ? If it has been done CBC show WBC 9.2 increased from 6.4 yesterday. Will have her undergo breathing treatments with albuterol due to her poor inspiratory and expiratory effort. Check UA. Hold on medrol dose pack until UA results return.

## 2015-05-28 LAB — TYPE AND SCREEN
ABO/RH(D): A NEG
Antibody Screen: POSITIVE
DAT, IGG: NEGATIVE
UNIT DIVISION: 0
Unit division: 0

## 2015-05-28 LAB — CBC
HEMATOCRIT: 29.9 % — AB (ref 36.0–46.0)
Hemoglobin: 9.6 g/dL — ABNORMAL LOW (ref 12.0–15.0)
MCH: 29 pg (ref 26.0–34.0)
MCHC: 32.1 g/dL (ref 30.0–36.0)
MCV: 90.3 fL (ref 78.0–100.0)
Platelets: 154 10*3/uL (ref 150–400)
RBC: 3.31 MIL/uL — ABNORMAL LOW (ref 3.87–5.11)
RDW: 16.3 % — ABNORMAL HIGH (ref 11.5–15.5)
WBC: 10 10*3/uL (ref 4.0–10.5)

## 2015-05-28 MED ORDER — GABAPENTIN 100 MG PO CAPS: 100.0000 mg | ORAL_CAPSULE | Freq: Every day | ORAL | Status: DC

## 2015-05-28 MED ORDER — SULFAMETHOXAZOLE-TRIMETHOPRIM 800-160 MG PO TABS: 1.0000 | ORAL_TABLET | Freq: Two times a day (BID) | ORAL | Status: DC

## 2015-05-28 MED ORDER — ASPIRIN 81 MG PO TBEC: 81.0000 mg | DELAYED_RELEASE_TABLET | Freq: Every day | ORAL | Status: DC

## 2015-05-28 MED ORDER — CLOPIDOGREL BISULFATE 75 MG PO TABS: 75.0000 mg | ORAL_TABLET | Freq: Every day | ORAL | Status: AC

## 2015-05-28 MED ORDER — OXYCODONE-ACETAMINOPHEN 5-325 MG PO TABS: 1.0000 | ORAL_TABLET | Freq: Four times a day (QID) | ORAL | Status: DC | PRN

## 2015-05-28 NOTE — Progress Notes (Signed)
Physical Therapy Treatment Patient Details Name: Caroline Ware MRN: [id number] DOB: [birth year] Today's Date: 05/28/2015    History of Present Illness Patient is an 81 yo female s/p LUMBAR DECOMPRESSIVE LAMINECTOMY L3-4 AND L4-5, TRANSFORAMINAL LUMBAR INTERBODY FUSION L4-5.    PT Comments    Patient with steady progress towards PT goals. Patient was able to tolerate increased activity and ambulation in halls this session. Patient continues to required cues for compliance with precautions and safety. Will continue to see and progress as tolerated.   Follow Up Recommendations  SNF;Supervision/Assistance - 24 hour     Equipment Recommendations  None recommended by PT    Recommendations for Other Services       Precautions / Restrictions Precautions Precautions: Back Precaution Booklet Issued: Yes (comment) Precaution Comments: verbally reviewed and provided hand out to patient Required Braces or Orthoses: Spinal Brace Spinal Brace: Lumbar corset Restrictions Weight Bearing Restrictions: No    Mobility  Bed Mobility               General bed mobility comments: received in chair  Transfers Overall transfer level: Needs assistance Equipment used: Rolling walker (2 wheeled) Transfers: Sit to/from Stand Sit to Stand: Mod assist;+2 physical assistance         General transfer comment: VCs for hand placement to stand with min assist to elevate to standing from chair and min guard from [facility].  Ambulation/Gait Ambulation/Gait assistance: Min assist Ambulation Distance (Feet): 80 Feet Assistive device: Rolling walker (2 wheeled) Gait Pattern/deviations: Step-through pattern;Decreased stride length;Drifts right/left Gait velocity: decreased Gait velocity interpretation: Below normal speed for age/gender General Gait Details: VCs for upright posture and cadence, assist for modest instability x2   Stairs            Wheelchair Mobility    Modified Rankin  (Stroke Patients Only)       Balance   Sitting-balance support: No upper extremity supported Sitting balance-Leahy Scale: Fair     Standing balance support: Bilateral upper extremity supported Standing balance-Leahy Scale: Fair                      Cognition Arousal/Alertness: Awake/alert Behavior During Therapy: WFL for tasks assessed/performed Overall Cognitive Status: Within Functional Limits for tasks assessed       Memory: Decreased recall of precautions              Exercises      General Comments        Pertinent Vitals/Pain Pain Assessment: 0-10 Pain Score: 5  Pain Location: back Pain Descriptors / Indicators: Sore Pain Intervention(s): Monitored during session;Repositioned    Home Living                      Prior Function            PT Goals (current goals can now be found in the care plan section) Acute Rehab PT Goals Patient Stated Goal: go to rehab PT Goal Formulation: With patient Time For Goal Achievement: 06/09/15 Potential to Achieve Goals: Good Progress towards PT goals: Progressing toward goals    Frequency  Min 5X/week    PT Plan Current plan remains appropriate    Co-evaluation             End of Session Equipment Utilized During Treatment: Gait belt;Back brace Activity Tolerance: Patient limited by fatigue;Patient limited by pain Patient left: in chair;with call bell/phone within reach;with chair alarm set;with family/visitor present  Time: 0454-0981 PT Time Calculation (min) (ACUTE ONLY): 18 min  Charges:  $Gait Training: 8-22 mins                    G CodesFabio Asa 06/20/15, 10:13 AM Charlotte Crumb, PT DPT  [phone]

## 2015-05-28 NOTE — Anesthesia Postprocedure Evaluation (Signed)
Anesthesia Post Note  Patient: Caroline Ware  Procedure(s) Performed: Procedure(s) (LRB): LUMBAR DECOMPRESSIVE LAMINECTOMY L3-4 AND L4-5, TRANSFORAMINAL LUMBAR INTERBODY FUSION L4-5 (N/A)  Anesthesia type: General  Patient location: PACU  Post pain: Pain level controlled  Post assessment: Post-op Vital signs reviewed  Last Vitals: BP 141/60 mmHg  Pulse 67  Temp(Src) 36.7 C (Oral)  Resp 16  Ht 5\' 1"  (1.549 m)  Wt 180 lb (81.647 kg)  BMI 34.03 kg/m2  SpO2 97%  Post vital signs: Reviewed  Level of consciousness: sedated  Complications: No apparent anesthesia complications

## 2015-05-28 NOTE — Progress Notes (Signed)
Patient ID: Caroline Ware, female   DOB: [birth year], 81 y.o.   MRN: [id number]     Subjective: 3 Days Post-Op Procedure(s) (LRB): LUMBAR DECOMPRESSIVE LAMINECTOMY L3-4 AND L4-5, TRANSFORAMINAL LUMBAR INTERBODY FUSION L4-5 (N/A) Awake, alert and oriented x 4, mild left thigh and leg discomfort. No fevers, no further loose stool. Transfused 2 units of PRBCs yesterday. Patient reports pain as mild.    Objective:   VITALS:  Temp:  [98.3 F (36.8 C)-101.3 F (38.5 C)] 98.3 F (36.8 C) (06/17 0553) Pulse Rate:  [72-86] 72 (06/17 0553) Resp:  [18-22] 20 (06/17 0553) BP: (89-128)/(23-52) 121/51 mmHg (06/17 0553) SpO2:  [91 %-100 %] 93 % (06/17 0553) Weight:  [81.647 kg (180 lb)] 81.647 kg (180 lb) (06/16 1300)  Neurologically intact ABD soft Sensation intact distally Intact pulses distally Dorsiflexion/Plantar flexion intact Incision: dressing C/D/I   LABS  Recent Labs  05/25/15 1409 05/25/15 1525  05/26/15 0411 05/27/15 1006 05/28/15 0450  HGB 8.5* 8.8*  --  8.2* 7.8* 9.6*  WBC  --   --   < > 6.2 9.5 10.0  PLT  --   --   < > 182 158 154  < > = values in this interval not displayed.  Recent Labs  05/26/15 0411 05/27/15 1006  NA 140 141  K 4.0 4.0  CL 110 113*  CO2 23 23  BUN 13 10  CREATININE 1.14* 1.33*  GLUCOSE 126* 124*   No results for input(s): LABPT, INR in the last 72 hours.   Assessment/Plan: 3 Days Post-Op Procedure(s) (LRB): LUMBAR DECOMPRESSIVE LAMINECTOMY L3-4 AND L4-5, TRANSFORAMINAL LUMBAR INTERBODY FUSION L4-5 (N/A)  ANEMIA UTI    Up with therapy D/C IV fluids Continue ABX therapy due to Post-op infection UTI,cultures pending. Discharge to SNF patient reports that she is not sure of the SNF here in [place] and would prefer to be considered for SNF in [place], [place]. Will work with Social Service to try to place. She is stable orthopaedically for transfer to SNF.  Adham Johnson E 05/28/2015, 8:10 AM

## 2015-05-28 NOTE — Progress Notes (Signed)
Report was called to [facility]

## 2015-05-28 NOTE — Clinical Social Work Placement (Signed)
   CLINICAL SOCIAL WORK PLACEMENT  NOTE  Date:  05/28/2015  Patient Details  Name: Caroline Ware MRN: [id number] Date of Birth: Dec 18, 1932  Clinical Social Work is seeking post-discharge placement for this patient at the Skilled  Nursing Facility level of care (*CSW will initial, date and re-position this form in  chart as items are completed):  Yes   Patient/family provided with [place] Clinical Social Work Department's list of facilities offering this level of care within the geographic area requested by the patient (or if unable, by the patient's family).  Yes   Patient/family informed of their freedom to choose among providers that offer the needed level of care, that participate in Medicare, Medicaid or managed care program needed by the patient, have an available bed and are willing to accept the patient.  Yes   Patient/family informed of [facility]'s ownership interest in [facility] and [facility], as well as of the fact that they are under no obligation to receive care at these facilities.  PASRR submitted to EDS on       PASRR number received on       Existing PASRR number confirmed on       FL2 transmitted to all facilities in geographic area requested by pt/family on 05/26/15     FL2 transmitted to all facilities within larger geographic area on       Patient informed that his/her managed care company has contracts with or will negotiate with certain facilities, including the following:        Yes   Patient/family informed of bed offers received.  Patient chooses bed at [facility]     Physician recommends and patient chooses bed at      Patient to be transferred to [facility] on 05/28/15.  Patient to be transferred to facility by Bernerd Limbo, son      Patient family notified on 05/28/15 of transfer.  Name of family member notified:  Jacqeline Stayner, son      PHYSICIAN       Additional Comment:     _______________________________________________ Gwynne Edinger, LCSW 05/28/2015, 2:37 PM

## 2015-05-28 NOTE — Discharge Summary (Signed)
Physician Discharge Summary      Patient ID: Caroline Ware MRN: [id number] DOB/AGE: [birth year] 81 y.o.  Admit date: 05/25/2015 Discharge date:05/28/2015   Admission Diagnoses:  Principal Problem:   Spinal stenosis, lumbar region, with neurogenic claudication Active Problems:   Spondylolisthesis of lumbar region   Presence of retained hardware   Postoperative anemia due to acute blood loss   Infection of urinary tract   Atelectasis   Discharge Diagnoses:  Same  Past Medical History  Diagnosis Date  . Hypertension     take Losartan  . Stroke   . Shortness of breath dyspnea     with exertion  . Hypothyroidism     takes synthroid  . Seasonal allergies   . GERD (gastroesophageal reflux disease)   . Arthritis   . Diverticulitis     Surgeries: Procedure(s): LUMBAR DECOMPRESSIVE LAMINECTOMY L3-4 AND L4-5, TRANSFORAMINAL LUMBAR INTERBODY FUSION L4-5 on 05/25/2015   Consultants:    Discharged Condition: Improved  Hospital Course: Caroline Ware is an 81 y.o. female who was admitted 05/25/2015 with a chief complaint of No chief complaint on file. , and found to have a diagnosis of Spinal stenosis, lumbar region, with neurogenic claudication.  She was brought to the operating room on 05/25/2015 and underwent the above named procedures.    She was given perioperative antibiotics:  Anti-infectives    Start     Dose/Rate Route Frequency Ordered Stop   05/28/15 0000  sulfamethoxazole-trimethoprim (BACTRIM DS,SEPTRA DS) 800-160 MG per tablet     1 tablet Oral Every 12 hours 05/28/15 0822     05/27/15 2200  sulfamethoxazole-trimethoprim (BACTRIM DS,SEPTRA DS) 800-160 MG per tablet 1 tablet     1 tablet Oral Every 12 hours 05/27/15 1815     05/26/15 0400  vancomycin (VANCOCIN) IVPB 1000 mg/200 mL premix     1,000 mg 200 mL/hr over 60 Minutes Intravenous  Once 05/25/15 1801 05/26/15 0442   05/25/15 0818  vancomycin (VANCOCIN) IVPB 1000 mg/200 mL premix     1,000 mg 200  mL/hr over 60 Minutes Intravenous On call to O.R. 05/25/15 0818 05/25/15 1106     Following surgery she was transferred to the PACU was stable and transferred to 4 N. there she underwent MedSurg postoperative care. Foley was discontinued on postoperative day #1 a lumbosacral orthosis then obtained and the patient begun on physical therapy and occupational therapy. She spiked a low-grade temperature on postoperative day #1 102. This responded to incentive spirometry. Laboratory tests demonstrated acute on chronic anemia limited to blood loss of her surgery. This was monitored. Her antihypertensive agents were held for low blood pressure. IV fluids were continued. Patient voided without difficulty following removal of her Foley catheter and physical therapy was instituted. On postoperative day #2 patient spiked a temperature to 103. CBC showed a slight elevation of her white cell count compared with her preadmission laboratory. Chest x-ray showed right lower lobe atelectasis but no pneumonia. Urinalysis was obtained. Patient was started on breathing treatments with albuterol for 4 treatments. Her fever improved however urinalysis demonstrated 21-50 white cells per high power field with rare epithelial cells consistent with urinary tract infection. She showed low blood pressure and follow-up laboratory showed that her hemoglobin had decreased to 7.8. Her preoperative hemoglobin was 10.8. She was transfused 2 units of blood without difficulty. Started on Septra DS one tablet every 12 hours for urinary tract infection pending results of urine culture and sensitivity. Her diet improved and she is to resume to  regular diet had a bowel movement on the second postoperative day. She had a prior to her admission indicated today want to be placed in skilled nursing facility in [place] for convenience to be seen at doctor's office postoperatively. However, during her admission she felt a so the skilled nursing facilities  available were not as close to home as she would like so that she would prefer to be placed in the skilled nursing facility in [place]. She had noted that she would like to be placed in [facility] in [place]. On postoperative day #3 her temperature was normal her blood pressure had returned to normal and she had resumed antihypertensive agents. She complains of some mild left thigh and leg discomfort she had preoperatively and was started on some gabapentin for his neurogenic discomfort. Unable to use the steroid medicines because of perioperative urinary tract infection. Skilled nursing facility placement was then undertaken patient was felt to be stable on postoperative day #3 hemoglobin at 9.9 with normal vital signs. Urine culture and sensitivities pending patient had C. difficile toxin testing of watery bowel movements on postoperative day #2 that returned negative.  She was given sequential compression devices, early ambulation, and chemoprophylaxis for DVT prophylaxis.  She benefited maximally from their hospital stay and there were no complications.    Recent vital signs:  Filed Vitals:   05/28/15 0553  BP: 121/51  Pulse: 72  Temp: 98.3 F (36.8 C)  Resp: 20    Recent laboratory studies:  Results for orders placed or performed during the hospital encounter of 05/25/15  Clostridium Difficile by PCR (not at [facility])  Result Value Ref Range   C difficile by pcr NEGATIVE NEGATIVE  CBC  Result Value Ref Range   WBC 6.2 4.0 - 10.5 K/uL   RBC 2.82 (L) 3.87 - 5.11 MIL/uL   Hemoglobin 8.2 (L) 12.0 - 15.0 g/dL   HCT 78.2 (L) 95.6 - 21.3 %   MCV 92.6 78.0 - 100.0 fL   MCH 29.1 26.0 - 34.0 pg   MCHC 31.4 30.0 - 36.0 g/dL   RDW 08.6 57.8 - 46.9 %   Platelets 182 150 - 400 K/uL  Basic Metabolic Panel  Result Value Ref Range   Sodium 140 135 - 145 mmol/L   Potassium 4.0 3.5 - 5.1 mmol/L   Chloride 110 101 - 111 mmol/L   CO2 23 22 - 32  mmol/L   Glucose, Bld 126 (H) 65 - 99 mg/dL   BUN 13 6 - 20 mg/dL   Creatinine, Ser 6.29 (H) 0.44 - 1.00 mg/dL   Calcium 8.1 (L) 8.9 - 10.3 mg/dL   GFR calc non Af Amer 44 (L) >60 mL/min   GFR calc Af Amer 51 (L) >60 mL/min   Anion gap 7 5 - 15  Urinalysis, Routine w reflex microscopic (not at [facility])  Result Value Ref Range   Color, Urine YELLOW YELLOW   APPearance CLOUDY (A) CLEAR   Specific Gravity, Urine 1.011 1.005 - 1.030   pH 5.5 5.0 - 8.0   Glucose, UA NEGATIVE NEGATIVE mg/dL   Hgb urine dipstick SMALL (A) NEGATIVE   Bilirubin Urine NEGATIVE NEGATIVE   Ketones, ur NEGATIVE NEGATIVE mg/dL   Protein, ur NEGATIVE NEGATIVE mg/dL   Urobilinogen, UA 1.0 0.0 - 1.0 mg/dL   Nitrite NEGATIVE NEGATIVE   Leukocytes, UA MODERATE (A) NEGATIVE  CBC  Result Value Ref Range   WBC 9.5 4.0 - 10.5 K/uL   RBC  2.71 (L) 3.87 - 5.11 MIL/uL   Hemoglobin 7.8 (L) 12.0 - 15.0 g/dL   HCT 16.1 (L) 09.6 - 04.5 %   MCV 94.1 78.0 - 100.0 fL   MCH 28.8 26.0 - 34.0 pg   MCHC 30.6 30.0 - 36.0 g/dL   RDW 40.9 81.1 - 91.4 %   Platelets 158 150 - 400 K/uL  Basic metabolic panel  Result Value Ref Range   Sodium 141 135 - 145 mmol/L   Potassium 4.0 3.5 - 5.1 mmol/L   Chloride 113 (H) 101 - 111 mmol/L   CO2 23 22 - 32 mmol/L   Glucose, Bld 124 (H) 65 - 99 mg/dL   BUN 10 6 - 20 mg/dL   Creatinine, Ser 7.82 (H) 0.44 - 1.00 mg/dL   Calcium 7.9 (L) 8.9 - 10.3 mg/dL   GFR calc non Af Amer 37 (L) >60 mL/min   GFR calc Af Amer 43 (L) >60 mL/min   Anion gap 5 5 - 15  Urine microscopic-add on  Result Value Ref Range   Squamous Epithelial / LPF RARE RARE   WBC, UA 21-50 <3 WBC/hpf   RBC / HPF 0-2 <3 RBC/hpf   Bacteria, UA MANY (A) RARE  CBC  Result Value Ref Range   WBC 10.0 4.0 - 10.5 K/uL   RBC 3.31 (L) 3.87 - 5.11 MIL/uL   Hemoglobin 9.6 (L) 12.0 - 15.0 g/dL   HCT 95.6 (L) 21.3 - 08.6 %   MCV 90.3 78.0 - 100.0 fL   MCH 29.0 26.0 - 34.0 pg   MCHC 32.1 30.0 - 36.0 g/dL   RDW 57.8 (H) 46.9 - 62.9 %    Platelets 154 150 - 400 K/uL  I-STAT 4, (NA,K, GLUC, HGB,HCT)  Result Value Ref Range   Sodium 142 135 - 145 mmol/L   Potassium 3.8 3.5 - 5.1 mmol/L   Glucose, Bld 91 65 - 99 mg/dL   HCT 52.8 (L) 41.3 - 24.4 %   Hemoglobin 8.5 (L) 12.0 - 15.0 g/dL  I-STAT 4, (NA,K, GLUC, HGB,HCT)  Result Value Ref Range   Sodium 143 135 - 145 mmol/L   Potassium 3.4 (L) 3.5 - 5.1 mmol/L   Glucose, Bld 92 65 - 99 mg/dL   HCT 01.0 (L) 27.2 - 53.6 %   Hemoglobin 8.8 (L) 12.0 - 15.0 g/dL  Type and screen  Result Value Ref Range   ABO/RH(D) A NEG    Antibody Screen POS    Sample Expiration 05/28/2015   Prepare RBC  Result Value Ref Range   Order Confirmation      ORDER PROCESSED BY BLOOD BANK BLOOD ALREADY AVAILABLE    Discharge Medications:     Medication List    STOP taking these medications        diazepam 5 MG tablet  Commonly known as:  VALIUM      TAKE these medications        aspirin 81 MG tablet  Take 81 mg by mouth at bedtime.     aspirin 81 MG EC tablet  Take 1 tablet (81 mg total) by mouth at bedtime.     calcium carbonate 600 MG Tabs tablet  Commonly known as:  OS-CAL  Take 600 mg by mouth 2 (two) times daily with a meal.     clopidogrel 75 MG tablet  Commonly known as:  PLAVIX  Take 1 tablet (75 mg total) by mouth daily.     COMBIGAN 0.2-0.5 % ophthalmic solution  Generic drug:  brimonidine-timolol  Place 1 drop into both eyes every 12 (twelve) hours.     [employer] EASY FIBER Pack  Take 1 Package by mouth daily.     gabapentin 100 MG capsule  Commonly known as:  NEURONTIN  Take 1 capsule (100 mg total) by mouth at bedtime.     HAIR SKIN & NAILS GUMMIES PO  Take 1 tablet by mouth daily.     HYDROcodone-acetaminophen 5-325 MG per tablet  Commonly known as:  NORCO/VICODIN  Take 1 tablet by mouth every 6 (six) hours as needed for moderate pain.     lansoprazole 30 MG capsule  Commonly known as:  PREVACID  Take 30 mg by mouth daily at 12 noon.     losartan 50 MG  tablet  Commonly known as:  COZAAR  Take 50 mg by mouth daily.     LUMIGAN 0.01 % Soln  Generic drug:  bimatoprost  Place 1 drop into both eyes at bedtime.     oxyCODONE-acetaminophen 5-325 MG per tablet  Commonly known as:  PERCOCET/ROXICET  Take 1-2 tablets by mouth every 6 (six) hours as needed for moderate pain.     potassium chloride SA 20 MEQ tablet  Commonly known as:  K-DUR,KLOR-CON  Take 20 mEq by mouth daily.     pravastatin 20 MG tablet  Commonly known as:  PRAVACHOL  Take 20 mg by mouth daily after breakfast.     SENIOR MULTIVITAMIN PLUS PO  Take 1 tablet by mouth daily.     sulfamethoxazole-trimethoprim 800-160 MG per tablet  Commonly known as:  BACTRIM DS,SEPTRA DS  Take 1 tablet by mouth every 12 (twelve) hours.     SYNTHROID 50 MCG tablet  Generic drug:  levothyroxine  Take 50 mcg by mouth daily before breakfast.     triamterene-hydrochlorothiazide 37.5-25 MG per capsule  Commonly known as:  DYAZIDE  Take 1 capsule by mouth daily.        Diagnostic Studies: Dg Lumbar Spine 2-3 Views  05/25/2015   CLINICAL DATA:  Elective surgery.  TLIF at L4-5  EXAM: LUMBAR SPINE - 2-3 VIEW; DG C-ARM GT 120 MIN  COMPARISON:  None.  FINDINGS: Based on the lowest open disc space there has been L4-5 discectomy with posterior rod and pedicle screw fixation. The cage is normally positioned and there is no evidence of extra osseous screw position or fracture. Interspinous spacer present at L5-S1. L4-5 anterolisthesis and L3-4 retrolisthesis which is mild.  IMPRESSION: Fluoroscopy for lumbar surgery, as above.   Electronically Signed   By: Marnee Spring M.D.   On: 05/25/2015 15:54   Dg Chest Port 1 View  05/27/2015   CLINICAL DATA:  Postop back surgery fever  EXAM: PORTABLE CHEST - 1 VIEW  COMPARISON:  None.  FINDINGS: Cardiac enlargement without heart failure. Mild right lower lobe atelectasis. Negative for pneumonia or effusion.  IMPRESSION: Mild right lower lobe atelectasis.   No definite pneumonia.   Electronically Signed   By: Marlan Palau M.D.   On: 05/27/2015 07:25   Dg C-arm Gt 120 Min  05/25/2015   CLINICAL DATA:  Elective surgery.  TLIF at L4-5  EXAM: LUMBAR SPINE - 2-3 VIEW; DG C-ARM GT 120 MIN  COMPARISON:  None.  FINDINGS: Based on the lowest open disc space there has been L4-5 discectomy with posterior rod and pedicle screw fixation. The cage is normally positioned and there is no evidence of extra osseous screw position or fracture. Interspinous spacer  present at L5-S1. L4-5 anterolisthesis and L3-4 retrolisthesis which is mild.  IMPRESSION: Fluoroscopy for lumbar surgery, as above.   Electronically Signed   By: Marnee Spring M.D.   On: 05/25/2015 15:54    Disposition: Final discharge disposition not confirmed      Discharge Instructions    Call MD / Call 911    Complete by:  As directed   If you experience chest pain or shortness of breath, CALL 911 and be transported to the hospital emergency room.  If you develope a fever above 101 F, pus (white drainage) or increased drainage or redness at the wound, or calf pain, call your surgeon's office.     Constipation Prevention    Complete by:  As directed   Drink plenty of fluids.  Prune juice may be helpful.  You may use a stool softener, such as Colace (over the counter) 100 mg twice a day.  Use MiraLax (over the counter) for constipation as needed.     Diet - low sodium heart healthy    Complete by:  As directed      Discharge instructions    Complete by:  As directed   Call if there is increasing drainage, fever greater than 101.5, severe head aches, and worsening nausea or light sensitivity. If shortness of breath, bloody cough or chest tightness or pain go to an emergency room. No lifting greater than 10 lbs. Avoid bending, stooping and twisting. Use brace when sitting and out of bed even to go to bathroom. Walk in house for first 2 weeks then may start to get out slowly increasing distances up  to one half mile by 4-6 weeks post op. After 5 days may shower and change dressing following bathing with shower.When bathing remove the brace shower and replace brace before getting out of the shower. If drainage, keep dry dressing and do not bathe the incision, use an moisture impervious dressing. Please call and return for scheduled follow up appointment 2 weeks from the time of surgery.     Driving restrictions    Complete by:  As directed   No driving for 6 weeks     Increase activity slowly as tolerated    Complete by:  As directed      Lifting restrictions    Complete by:  As directed   No lifting for 6 weeks           Follow-up Information    Follow up with Shereen Marton E, MD In 2 weeks.   Specialty:  Orthopedic Surgery   Why:  For wound re-check   Contact information:   [address] [place] [address] [phone]        Signed: Kerrin Champagne 05/28/2015, 8:23 AM

## 2015-05-28 NOTE — Progress Notes (Signed)
  Pharmacy Discharge Medication Therapy Review   Total Number of meds on admission ____17______ (polypharmacy > 10 meds)  Indications for all medications: [x]  Yes       []  No  Adherence Review  []  Excellent (no doses missed/week)     []  Good (no more than 1 dose missed/week)     []  Partial (2-3 doses missed/week)     []  Poor (>3 doses missed/week)     [x]  Not Assessed  Total number of high risk medications __3__ (Anticoagulants, Dual antiplatelets, oral Antihyperglycemic agents,Insulins, Antipsychotics, Anti-Seizure meds, Inhalers, HF/ACS meds, Antibiotics and HIV medications)   Assessment: (Medication related problems)  Intervention  YES NO  Explanation   Indications      Medication without noted indication []  [x]     Indication without noted medication []  [x]     Duplicate therapy [x]  []  Upon review, the following order was found to be duplicated: Aspirin 81 mg EC tablet - Take 1 tablet (81 mg total) by mouth at bedtime. Pharmacy to D/C duplication of order.  Efficacy      Suboptimal drug or dose selection []  [x]    Insufficient dose/duration []  [x]    Failure to receive therapy  (Rx not filled) []  [x]     Safety      Adverse drug event []  [x]     Drug interaction []  [x]     Excessive dose/duration []  [x]    High-risk medications []  [x]    Compliance     Underuse []  [x]     Overuse []  [x]    Other pertinent pharmacist counseling []  [x]      Total number of new medications upon discharge: ____4______  Time:  Time spent preparing for discharge counseling: 0 minutes Time spent counseling patient:0 minutes Additional time spent on discharge (specify): 5 minutes reviewing discharge medications, discontinuing duplicated order, and typing progress note.   PLAN:  Consider the following at discharge/Recommendations discussed with provider - DC - Aspirin 81 mg EC tablet - Take 1 tablet (81 mg total) by mouth at bedtime.

## 2015-05-28 NOTE — Clinical Social Work Note (Addendum)
CSW reviewed chart. When the pt is medically stable she can transition to [facility].   Addendum: Pt declined [facility]. Pt reported that she would now like [address], [facility] or [facility].   Addendum: Pt has bed offers at [facility], [facility] or [facility]. CSW informed the pt's son Jonny Ruiz. John reported that he will tour the three places before he make his discussion.   Charvis Lightner, MSW, LCSWA [phone]

## 2015-05-29 LAB — URINE CULTURE: Culture: >=100000

## 2015-05-31 ENCOUNTER — Non-Acute Institutional Stay (SKILLED_NURSING_FACILITY): Payer: Medicare Other | Admitting: Adult Health

## 2015-05-31 DIAGNOSIS — I10 Essential (primary) hypertension: Secondary | ICD-10-CM

## 2015-05-31 DIAGNOSIS — G629 Polyneuropathy, unspecified: Secondary | ICD-10-CM

## 2015-05-31 DIAGNOSIS — M48062 Spinal stenosis, lumbar region with neurogenic claudication: Secondary | ICD-10-CM

## 2015-05-31 DIAGNOSIS — D62 Acute posthemorrhagic anemia: Secondary | ICD-10-CM | POA: Diagnosis not present

## 2015-05-31 DIAGNOSIS — E876 Hypokalemia: Secondary | ICD-10-CM | POA: Diagnosis not present

## 2015-05-31 DIAGNOSIS — E039 Hypothyroidism, unspecified: Secondary | ICD-10-CM

## 2015-05-31 DIAGNOSIS — K219 Gastro-esophageal reflux disease without esophagitis: Secondary | ICD-10-CM

## 2015-05-31 DIAGNOSIS — I2510 Atherosclerotic heart disease of native coronary artery without angina pectoris: Secondary | ICD-10-CM | POA: Diagnosis not present

## 2015-05-31 DIAGNOSIS — N390 Urinary tract infection, site not specified: Secondary | ICD-10-CM

## 2015-05-31 DIAGNOSIS — M4806 Spinal stenosis, lumbar region: Secondary | ICD-10-CM

## 2015-05-31 LAB — STOOL CULTURE

## 2015-06-01 ENCOUNTER — Non-Acute Institutional Stay (SKILLED_NURSING_FACILITY): Payer: Medicare Other | Admitting: Internal Medicine

## 2015-06-01 ENCOUNTER — Encounter: Payer: Self-pay | Admitting: Adult Health

## 2015-06-01 DIAGNOSIS — M4806 Spinal stenosis, lumbar region: Secondary | ICD-10-CM

## 2015-06-01 DIAGNOSIS — I10 Essential (primary) hypertension: Secondary | ICD-10-CM | POA: Diagnosis not present

## 2015-06-01 DIAGNOSIS — I25810 Atherosclerosis of coronary artery bypass graft(s) without angina pectoris: Secondary | ICD-10-CM | POA: Diagnosis not present

## 2015-06-01 DIAGNOSIS — E039 Hypothyroidism, unspecified: Secondary | ICD-10-CM

## 2015-06-01 DIAGNOSIS — D62 Acute posthemorrhagic anemia: Secondary | ICD-10-CM

## 2015-06-01 DIAGNOSIS — K219 Gastro-esophageal reflux disease without esophagitis: Secondary | ICD-10-CM | POA: Diagnosis not present

## 2015-06-01 DIAGNOSIS — M792 Neuralgia and neuritis, unspecified: Secondary | ICD-10-CM

## 2015-06-01 DIAGNOSIS — N390 Urinary tract infection, site not specified: Secondary | ICD-10-CM | POA: Diagnosis not present

## 2015-06-01 DIAGNOSIS — B9620 Unspecified Escherichia coli [E. coli] as the cause of diseases classified elsewhere: Secondary | ICD-10-CM

## 2015-06-01 DIAGNOSIS — M48062 Spinal stenosis, lumbar region with neurogenic claudication: Secondary | ICD-10-CM

## 2015-06-01 NOTE — Progress Notes (Signed)
Patient ID: Caroline Ware, female   DOB: [birth year], 81 y.o.   MRN: [id number]   05/31/15  Facility:  Nursing Home Location:  [facility] Room Number: 203-2 LEVEL OF CARE:  SNF (31)    Chief Complaint  Patient presents with  . Hospitalization Follow-up    Spinal stenosis of lumbar region with neurogenic claudication S/P lumbar decompressive laminectomy L3-4 and L4-5 and fusion, anemia, hypertension, UTI, neuropathy, CAD, GERD, hypokalemia and hypothyroidism    HISTORY OF PRESENT ILLNESS:  This is an 81 year old female who has been admitted to [facility] on 05/28/15 from [facility] with spinal stenosis of the lumbar region with neurogenic claudication S/P lumbar decompressive laminectomy L3-4 and L4-5, transforaminal lumbar interbody fusion L4-5.Marland Kitchen She has PMH of hypertension, stroke, hypothyroidism, seasonal allergy, GERD, arthritis and diverticulitis.    She has been admitted for a short-term rehabilitation.  PAST MEDICAL HISTORY:  Past Medical History  Diagnosis Date  . Hypertension     take Losartan  . Stroke   . Shortness of breath dyspnea     with exertion  . Hypothyroidism     takes synthroid  . Seasonal allergies   . GERD (gastroesophageal reflux disease)   . Arthritis   . Diverticulitis     CURRENT MEDICATIONS: Reviewed per MAR/see medication list  Allergies  Allergen Reactions  . Betadine [Povidone Iodine] Swelling  . Iodine Swelling  . Penicillins Anaphylaxis and Swelling  . Bee Venom Swelling  . Fish Allergy Swelling  . Ivp Dye [Iodinated Diagnostic Agents]   . Nexium [Esomeprazole Magnesium]   . Peanuts [Peanut Oil] Swelling  . Tetanus Toxoids Swelling     REVIEW OF SYSTEMS:  GENERAL: no change in appetite, no fatigue, no weight changes, no fever, chills or weakness RESPIRATORY: no cough, SOB, DOE, wheezing, hemoptysis CARDIAC: no chest pain, edema or palpitations GI: no abdominal pain, diarrhea,  constipation, heart burn, nausea or vomiting  PHYSICAL EXAMINATION  GENERAL: no acute distress, normal body habitus SKIN:  Lower back spinal surgical wound is dry, covered with dry dressing, no erythema EYES: conjunctivae normal, sclerae normal, normal eye lids NECK: supple, trachea midline, no neck masses, no thyroid tenderness, no thyromegaly LYMPHATICS: no LAN in the neck, no supraclavicular LAN RESPIRATORY: breathing is even & unlabored, BS CTAB CARDIAC: RRR, no murmur,no extra heart sounds, no edema GI: abdomen soft, normal BS, no masses, no tenderness, no hepatomegaly, no splenomegaly EXTREMITIES:  Able to move 4 extremities PSYCHIATRIC: the patient is alert & oriented to person, affect & behavior appropriate  LABS/RADIOLOGY: Labs reviewed: Basic Metabolic Panel:  Recent Labs  56/21/30 1542  05/25/15 1525 05/26/15 0411 05/27/15 1006  NA 141  < > 143 140 141  K 3.7  < > 3.4* 4.0 4.0  CL 106  --   --  110 113*  CO2 25  --   --  23 23  GLUCOSE 88  < > 92 126* 124*  BUN 24*  --   --  13 10  CREATININE 1.67*  --   --  1.14* 1.33*  CALCIUM 9.4  --   --  8.1* 7.9*  < > = values in this interval not displayed. Liver Function Tests:  Recent Labs  05/20/15 1542  AST 26  ALT 20  ALKPHOS 57  BILITOT 0.4  PROT 7.3  ALBUMIN 3.8   CBC:  Recent Labs  05/26/15 0411 05/27/15 1006 05/28/15 0450  WBC 6.2 9.5 10.0  HGB 8.2*  7.8* 9.6*  HCT 26.1* 25.5* 29.9*  MCV 92.6 94.1 90.3  PLT 182 158 154    Dg Lumbar Spine 2-3 Views  05/25/2015   CLINICAL DATA:  Elective surgery.  TLIF at L4-5  EXAM: LUMBAR SPINE - 2-3 VIEW; DG C-ARM GT 120 MIN  COMPARISON:  None.  FINDINGS: Based on the lowest open disc space there has been L4-5 discectomy with posterior rod and pedicle screw fixation. The cage is normally positioned and there is no evidence of extra osseous screw position or fracture. Interspinous spacer present at L5-S1. L4-5 anterolisthesis and L3-4 retrolisthesis which is  mild.  IMPRESSION: Fluoroscopy for lumbar surgery, as above.   Electronically Signed   By: Marnee Spring M.D.   On: 05/25/2015 15:54   Dg Chest Port 1 View  05/27/2015   CLINICAL DATA:  Postop back surgery fever  EXAM: PORTABLE CHEST - 1 VIEW  COMPARISON:  None.  FINDINGS: Cardiac enlargement without heart failure. Mild right lower lobe atelectasis. Negative for pneumonia or effusion.  IMPRESSION: Mild right lower lobe atelectasis.  No definite pneumonia.   Electronically Signed   By: Marlan Palau M.D.   On: 05/27/2015 07:25   Dg C-arm Gt 120 Min  05/25/2015   CLINICAL DATA:  Elective surgery.  TLIF at L4-5  EXAM: LUMBAR SPINE - 2-3 VIEW; DG C-ARM GT 120 MIN  COMPARISON:  None.  FINDINGS: Based on the lowest open disc space there has been L4-5 discectomy with posterior rod and pedicle screw fixation. The cage is normally positioned and there is no evidence of extra osseous screw position or fracture. Interspinous spacer present at L5-S1. L4-5 anterolisthesis and L3-4 retrolisthesis which is mild.  IMPRESSION: Fluoroscopy for lumbar surgery, as above.   Electronically Signed   By: Marnee Spring M.D.   On: 05/25/2015 15:54    ASSESSMENT/PLAN:  Spinal stenosis of lumbar region with neurogenic claudication S/P lumbar decompressive laminectomy L3-4 and L4-5, transforaminal lumbar interbody fusion L4-5 - for rehabilitation; follow-up with Dr. Otelia Sergeant, orthopedic surgeon, in 2 weeks; continue Norco 5/325 mg 1 tab by mouth every 6 hours when necessary and Percocet 5/325 mg 1-2 tabs by mouth every 6 hours when necessary for pain Anemia, acute blood loss - S/P transfusion of 2 units packed RBCs; hemoglobin 9.6 Hypertension  - continue losartan 50 mg by mouth daily and Dyazide 37.5-25 mg 1 capsule by mouth daily UTI - continue Septra DS one tab by mouth every 12 hours Neuropathy - continue Neurontin 100 mg by mouth daily at bedtime CAD - stable; continue Plavix 75 mg by mouth daily and aspirin 81 mg by  mouth daily at bedtime GERD - continue Prevacid 30 mg by mouth every 12 Hypokalemia - K4.0; continue K Dur 20 MEQ by mouth daily Hypothyroidism - continue Synthroid 50 g 1 tab by mouth daily    Goals of care:  Short-term rehabilitation   Labs/test ordered:  CBC, BMP and TSH  Spent 50 minutes in patient care.    [facility], NP [employer] [phone]

## 2015-06-01 NOTE — Progress Notes (Signed)
Patient ID: Caroline Ware, female   DOB: 10-26-1933, 81 y.o.   MRN: [id number]     [facility]   PCP: PROVIDER NOT IN SYSTEM  Code Status: full code  Allergies  Allergen Reactions  . Betadine [Povidone Iodine] Swelling  . Iodine Swelling  . Penicillins Anaphylaxis and Swelling  . Bee Venom Swelling  . Fish Allergy Swelling  . Ivp Dye [Iodinated Diagnostic Agents]   . Nexium [Esomeprazole Magnesium]   . Peanuts [Peanut Oil] Swelling  . Tetanus Toxoids Swelling    Chief Complaint  Patient presents with  . New Admit To SNF     HPI:  81 year old patient is here for short term rehabilitation post hospital admission from 05/25/15-05/28/15 with lumbar spinal stenosis with neurogenic claudication. She underwent lumbar decompressive laminectomy L3-4 and L4-5 with transforaminal lumbar interbody fusion. Post op she had blood loss anemia and e.coli uti. She is seen in her room today. She denies any concerns. Her pain is under control with current regimen. She had a bowel movement this am. No concerns from staff she is working with therapy team.  Review of Systems:  Constitutional: Negative for fever, chills, diaphoresis.  HENT: Negative for headache, congestion, nasal discharge Eyes: Negative for eye pain, blurred vision, double vision and discharge.  Respiratory: Negative for cough, shortness of breath and wheezing.   Cardiovascular: Negative for chest pain, palpitations, leg swelling.  Gastrointestinal: Negative for heartburn, nausea, vomiting, abdominal pain Genitourinary: Negative for dysuria Musculoskeletal: Negative for  Falls Skin: Negative for itching, rash.  Neurological: Negative for dizziness, tingling, numbness, focal weakness Psychiatric/Behavioral: Negative for depression    Past Medical History  Diagnosis Date  . Hypertension     take Losartan  . Stroke   . Shortness of breath dyspnea     with exertion  . Hypothyroidism    takes synthroid  . Seasonal allergies   . GERD (gastroesophageal reflux disease)   . Arthritis   . Diverticulitis    Past Surgical History  Procedure Laterality Date  . Rotator cuff repair Bilateral   . Cholecystectomy    . Bunionectomy    . Hand Right   . Cardiac catheterization    . Colonoscopy    . Back surgery    . Lumbar fusion N/A 05/25/2015    Procedure: LUMBAR DECOMPRESSIVE LAMINECTOMY L3-4 AND L4-5, TRANSFORAMINAL LUMBAR INTERBODY FUSION L4-5;  Surgeon: Kerrin Champagne, MD;  Location: [facility] OR;  Service: Orthopedics;  Laterality: N/A;   Social History:   reports that she has never smoked. She does not have any smokeless tobacco history on file. She reports that she drinks alcohol. She reports that she does not use illicit drugs.  No family history on file.  Medications: Patient's Medications  New Prescriptions   No medications on file  Previous Medications   ASPIRIN EC 81 MG EC TABLET    Take 1 tablet (81 mg total) by mouth at bedtime.   BIOTIN W/ VITAMINS C & E (HAIR SKIN & NAILS GUMMIES PO)    Take 1 tablet by mouth daily.   CALCIUM CARBONATE (OS-CAL) 600 MG TABS TABLET    Take 600 mg by mouth 2 (two) times daily with a meal.   CLOPIDOGREL (PLAVIX) 75 MG TABLET    Take 1 tablet (75 mg total) by mouth daily.   COMBIGAN 0.2-0.5 % OPHTHALMIC SOLUTION    Place 1 drop into both eyes every 12 (twelve) hours.    CORN DEXTRIN ([employer]  EASY FIBER) PACK    Take 1 Package by mouth daily.   GABAPENTIN (NEURONTIN) 100 MG CAPSULE    Take 1 capsule (100 mg total) by mouth at bedtime.   LANSOPRAZOLE (PREVACID) 30 MG CAPSULE    Take 30 mg by mouth daily at 12 noon.   LOSARTAN (COZAAR) 50 MG TABLET    Take 50 mg by mouth daily.   LUMIGAN 0.01 % SOLN    Place 1 drop into both eyes at bedtime.    MULTIPLE VITAMINS-MINERALS (SENIOR MULTIVITAMIN PLUS PO)    Take 1 tablet by mouth daily.   OXYCODONE-ACETAMINOPHEN (PERCOCET/ROXICET) 5-325 MG PER TABLET    Take 1-2 tablets by mouth every 6 (six) hours  as needed for moderate pain.   POTASSIUM CHLORIDE SA (K-DUR,KLOR-CON) 20 MEQ TABLET    Take 20 mEq by mouth daily.   PRAVASTATIN (PRAVACHOL) 20 MG TABLET    Take 20 mg by mouth daily after breakfast.    SULFAMETHOXAZOLE-TRIMETHOPRIM (BACTRIM DS,SEPTRA DS) 800-160 MG PER TABLET    Take 1 tablet by mouth every 12 (twelve) hours.   SYNTHROID 50 MCG TABLET    Take 50 mcg by mouth daily before breakfast.    TRIAMTERENE-HYDROCHLOROTHIAZIDE (DYAZIDE) 37.5-25 MG PER CAPSULE    Take 1 capsule by mouth daily.   Modified Medications   No medications on file  Discontinued Medications   No medications on file     Physical Exam: Filed Vitals:   06/01/15 1832  BP: 129/60  Pulse: 60  Temp: 98.3 F (36.8 C)  Resp: 18  Weight: 182 lb (82.555 kg)  SpO2: 95%    General- elderly female, in no acute distress Head- normocephalic, atraumatic Throat- moist mucus membrane Eyes- PERRLA, EOMI, no pallor, no icterus, no discharge, normal conjunctiva, normal sclera Neck- no cervical lymphadenopathy, no jugular vein distension Cardiovascular- normal s1,s2, no murmurs, palpable dorsalis pedis, no leg edema Respiratory- bilateral clear to auscultation, no wheeze, no rhonchi, no crackles, no use of accessory muscles Abdomen- bowel sounds present, soft, non tender Musculoskeletal- able to move all 4 extremities, generalized weakness, back brace used when out of bed Neurological- no focal deficit Skin- warm and dry, has surgical incision on lumbar region with steri strips in place and two stained area on dressing with old blood, no signs of infection Psychiatry- alert and oriented to person, place and time, normal mood and affect    Labs reviewed: Basic Metabolic Panel:  Recent Labs  21/19/41 1542  05/25/15 1525 05/26/15 0411 05/27/15 1006  NA 141  < > 143 140 141  K 3.7  < > 3.4* 4.0 4.0  CL 106  --   --  110 113*  CO2 25  --   --  23 23  GLUCOSE 88  < > 92 126* 124*  BUN 24*  --   --  13 10    CREATININE 1.67*  --   --  1.14* 1.33*  CALCIUM 9.4  --   --  8.1* 7.9*  < > = values in this interval not displayed. Liver Function Tests:  Recent Labs  05/20/15 1542  AST 26  ALT 20  ALKPHOS 57  BILITOT 0.4  PROT 7.3  ALBUMIN 3.8   No results for input(s): LIPASE, AMYLASE in the last 8760 hours. No results for input(s): AMMONIA in the last 8760 hours. CBC:  Recent Labs  05/26/15 0411 05/27/15 1006 05/28/15 0450  WBC 6.2 9.5 10.0  HGB 8.2* 7.8* 9.6*  HCT 26.1* 25.5*  29.9*  MCV 92.6 94.1 90.3  PLT 182 158 154    Assessment/Plan  Lumbar spinal stenosis with neurogenic claudication  S/P lumbar decompressive laminectomy L3-4 and L4-5, transforaminal lumbar interbody fusion L4-5. Will have her work with physical therapy and occupational therapy team to help with gait training and muscle strengthening exercises.fall precautions. Skin care. Encourage to be out of bed. Continue Percocet 5/325 mg 1-2 tabs q6h prn for pain. Has follow up with orthopedics.  Blood loss anemia Post op, s/p 2 u prbc transfusion, monitor h&h  Hypertension Elevated bp, monitor bp readings, continue losartan 50 mg daily and Dyazide 37.5-25 mg daily for now  neuropathic pain Stable, continue neurontin 100 mg daily  Hypothyroidism Continue synthroid 50 mcg daily  gerd Stable, continue pravachol 30 mg bid for now  E.coli UTI continue Septra DS one tab bid for total of 2 weeks, hydration encouraged  CAD  Remains chest paun free. Continue losartan 50 mg daily, pravastatin 20 mg daily with aspirin and plavix for now   Goals of care: short term rehabilitation   Labs/tests ordered  Family/ staff Communication: reviewed care plan with patient and nursing supervisor    Oneal Grout, MD  [facility] [phone] (Monday-Friday 8 am - 5 pm) [phone] (afterhours)

## 2015-06-11 ENCOUNTER — Encounter: Payer: Self-pay | Admitting: Adult Health

## 2015-06-11 ENCOUNTER — Non-Acute Institutional Stay (SKILLED_NURSING_FACILITY): Payer: Medicare Other | Admitting: Adult Health

## 2015-06-11 DIAGNOSIS — E039 Hypothyroidism, unspecified: Secondary | ICD-10-CM | POA: Diagnosis not present

## 2015-06-11 DIAGNOSIS — E876 Hypokalemia: Secondary | ICD-10-CM

## 2015-06-11 DIAGNOSIS — M792 Neuralgia and neuritis, unspecified: Secondary | ICD-10-CM | POA: Diagnosis not present

## 2015-06-11 DIAGNOSIS — M48062 Spinal stenosis, lumbar region with neurogenic claudication: Secondary | ICD-10-CM

## 2015-06-11 DIAGNOSIS — I10 Essential (primary) hypertension: Secondary | ICD-10-CM

## 2015-06-11 DIAGNOSIS — D62 Acute posthemorrhagic anemia: Secondary | ICD-10-CM

## 2015-06-11 DIAGNOSIS — M4806 Spinal stenosis, lumbar region: Secondary | ICD-10-CM | POA: Diagnosis not present

## 2015-06-11 DIAGNOSIS — K219 Gastro-esophageal reflux disease without esophagitis: Secondary | ICD-10-CM | POA: Diagnosis not present

## 2015-06-14 NOTE — Progress Notes (Signed)
Patient ID: Caroline LappingBernice Schertzer, female   DOB: [birth year], 81 y.o.   MRN: [id number]   06/11/15  Facility:  Nursing Home Location[facility]  [facility] Room Number: 203-2 LEVEL OF CARE:  SNF (31)   Chief Complaint  Patient presents with  . Discharge Note    Spinal stenosis of lumbar region with neurogenic claudication S/P lumbar decompressive laminectomy L3-4 and L4-5 and fusion, anemia, hypertension, neuropathy, CAD, GERD, hypokalemia and hypothyroidism    HISTORY OF PRESENT ILLNESS:  This is an 81 year old female who is for discharge home with Home health PT for endurance, OT for ADLs and CNA for showers. DME:  Youth rolling walker and bedside commode. [facility] with spinal stenosis of the lumbar region with neurogenic claudication S/P lumbar decompressive laminectomy L3-4 and L4-5, transforaminal lumbar interbody fusion L4-5.Marland Kitchen. She has PMH of hypertension, stroke, hypothyroidism, seasonal allergy, GERD, arthritis and diverticulitis.    Patient was admitted to this facility for short-term rehabilitation after the patient's recent hospitalization.  Patient has completed SNF rehabilitation and therapy has cleared the patient for discharge.   PAST MEDICAL HISTORY:  Past Medical History  Diagnosis Date  . Hypertension     take Losartan  . Stroke   . Shortness of breath dyspnea     with exertion  . Hypothyroidism     takes synthroid  . Seasonal allergies   . GERD (gastroesophageal reflux disease)   . Arthritis   . Diverticulitis     CURRENT MEDICATIONS: Reviewed per MAR/see medication list  Allergies  Allergen Reactions  . Betadine [Povidone Iodine] Swelling  . Iodine Swelling  . Penicillins Anaphylaxis and Swelling  . Bee Venom Swelling  . Fish Allergy Swelling  . Ivp Dye [Iodinated Diagnostic Agents]   . Nexium [Esomeprazole Magnesium]   . Peanuts [Peanut Oil] Swelling  . Tetanus Toxoids  Swelling     REVIEW OF SYSTEMS:  GENERAL: no change in appetite, no fatigue, no weight changes, no fever, chills or weakness RESPIRATORY: no cough, SOB, DOE, wheezing, hemoptysis CARDIAC: no chest pain, edema or palpitations GI: no abdominal pain, diarrhea, constipation, heart burn, nausea or vomiting  PHYSICAL EXAMINATION  GENERAL: no acute distress, normal body habitus SKIN:  Lower back spinal surgical wound is dry, covered with steri-strips and dry dressing, no erythema NECK: supple, trachea midline, no neck masses, no thyroid tenderness, no thyromegaly LYMPHATICS: no LAN in the neck, no supraclavicular LAN RESPIRATORY: breathing is even & unlabored, BS CTAB CARDIAC: RRR, no murmur,no extra heart sounds, no edema GI: abdomen soft, normal BS, no masses, no tenderness, no hepatomegaly, no splenomegaly EXTREMITIES:  Able to move 4 extremities; wears TLSO PSYCHIATRIC: the patient is alert & oriented to person, affect & behavior appropriate  LABS/RADIOLOGY: Labs reviewed: 06/01/15  WBC 6.1 hemoglobin 10.1 hematocrit 31.1 MCV 86.6 platelet 278 sodium 140 potassium 4.5 glucose 89 BUN 17 creatinine 1.38 calcium 9.2 TSH 2.026 Basic Metabolic Panel:  Recent Labs  40/98/1105/08/26 1542  05/25/15 1525 05/26/15 0411 05/27/15 1006  NA 141  < > 143 140 141  K 3.7  < > 3.4* 4.0 4.0  CL 106  --   --  110 113*  CO2 25  --   --  23 23  GLUCOSE 88  < > 92 126* 124*  BUN 24*  --   --  13 10  CREATININE 1.67*  --   --  1.14* 1.33*  CALCIUM  9.4  --   --  8.1* 7.9*  < > = values in this interval not displayed. Liver Function Tests:  Recent Labs  05/20/15 1542  AST 26  ALT 20  ALKPHOS 57  BILITOT 0.4  PROT 7.3  ALBUMIN 3.8   CBC:  Recent Labs  05/26/15 0411 05/27/15 1006 05/28/15 0450  WBC 6.2 9.5 10.0  HGB 8.2* 7.8* 9.6*  HCT 26.1* 25.5* 29.9*  MCV 92.6 94.1 90.3  PLT 182 158 154    Dg Lumbar Spine 2-3 Views  05/25/2015   CLINICAL DATA:  Elective surgery.  TLIF at L4-5  EXAM:  LUMBAR SPINE - 2-3 VIEW; DG C-ARM GT 120 MIN  COMPARISON:  None.  FINDINGS: Based on the lowest open disc space there has been L4-5 discectomy with posterior rod and pedicle screw fixation. The cage is normally positioned and there is no evidence of extra osseous screw position or fracture. Interspinous spacer present at L5-S1. L4-5 anterolisthesis and L3-4 retrolisthesis which is mild.  IMPRESSION: Fluoroscopy for lumbar surgery, as above.   Electronically Signed   By: Marnee Spring M.D.   On: 05/25/2015 15:54   Dg Chest Port 1 View  05/27/2015   CLINICAL DATA:  Postop back surgery fever  EXAM: PORTABLE CHEST - 1 VIEW  COMPARISON:  None.  FINDINGS: Cardiac enlargement without heart failure. Mild right lower lobe atelectasis. Negative for pneumonia or effusion.  IMPRESSION: Mild right lower lobe atelectasis.  No definite pneumonia.   Electronically Signed   By: Marlan Palau M.D.   On: 05/27/2015 07:25   Dg C-arm Gt 120 Min  05/25/2015   CLINICAL DATA:  Elective surgery.  TLIF at L4-5  EXAM: LUMBAR SPINE - 2-3 VIEW; DG C-ARM GT 120 MIN  COMPARISON:  None.  FINDINGS: Based on the lowest open disc space there has been L4-5 discectomy with posterior rod and pedicle screw fixation. The cage is normally positioned and there is no evidence of extra osseous screw position or fracture. Interspinous spacer present at L5-S1. L4-5 anterolisthesis and L3-4 retrolisthesis which is mild.  IMPRESSION: Fluoroscopy for lumbar surgery, as above.   Electronically Signed   By: Marnee Spring M.D.   On: 05/25/2015 15:54    ASSESSMENT/PLAN:  Spinal stenosis of lumbar region with neurogenic claudication S/P lumbar decompressive laminectomy L3-4 and L4-5, transforaminal lumbar interbody fusion L4-5 - for Home health PT, OT and CNA; follow-up with Dr. Otelia Sergeant, orthopedic surgeon; continue Norco 5/325 mg 1 tab by mouth every 6 hours when necessary and Percocet 5/325 mg 1-2 tabs by mouth every 6 hours when necessary for  pain Anemia, acute blood loss - S/P transfusion of 2 units packed RBCs; hemoglobin 10.1 Hypertension  - continue losartan 50 mg by mouth daily and Dyazide 37.5-25 mg 1 capsule by mouth daily UTI - resolved Neuropathy - continue Neurontin 100 mg by mouth daily at bedtime CAD - stable; continue Plavix 75 mg by mouth daily and aspirin 81 mg by mouth daily at bedtime GERD - continue Prevacid 30 mg by mouth every 12 Hypokalemia - K4.5; continue K Dur 20 MEQ by mouth daily Hypothyroidism - continue Synthroid 50 g 1 tab by mouth daily    I have filled out patient's discharge paperwork and written prescriptions.  Patient will receive home health PT, OT and CNA.  DME provided:  Youth rolling walker and bedside commode  Total discharge time: Greater than 30 minutes  Discharge time involved coordination of the discharge process with Child psychotherapist,  nursing staff and therapy department. Medical justification for home health services/DME verified.   [facility], NP [employer] [phone]

## 2017-03-31 IMAGING — MR MR LUMBAR SPINE W/O CM
4 of 5 series · 16 of 48 positions shown · non-contrast
Comparison: None.

CLINICAL DATA: Low back pain with LEFT leg pain and weakness since
January 2015. Neurogenic claudication. LEFT greater than RIGHT leg
pain. Previous surgery in 5448.

EXAM:
MRI LUMBAR SPINE WITHOUT CONTRAST
TECHNIQUE: Multiplanar, multisequence MR imaging of the lumbar spine was
performed. No intravenous contrast was administered.

[Series 3: T1 · sagittal · 4.0mm · 0.84mm/px · 3 of 14 slices shown (1 of 2)]
[im 3/14]
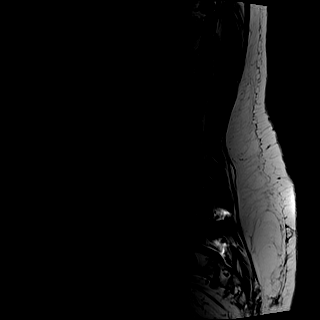
[im 7/14]
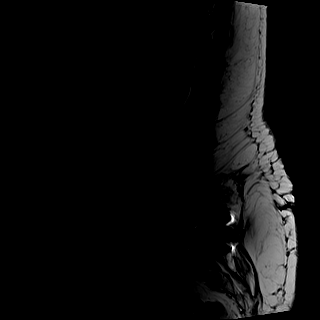
[im 11/14]
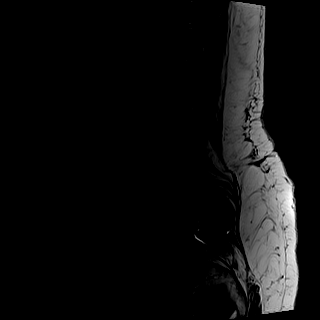

[Series 4: T2 · sagittal · 4.0mm · 0.42mm/px · 7 of 14 slices shown (1 of 2)]
[im 1/14]
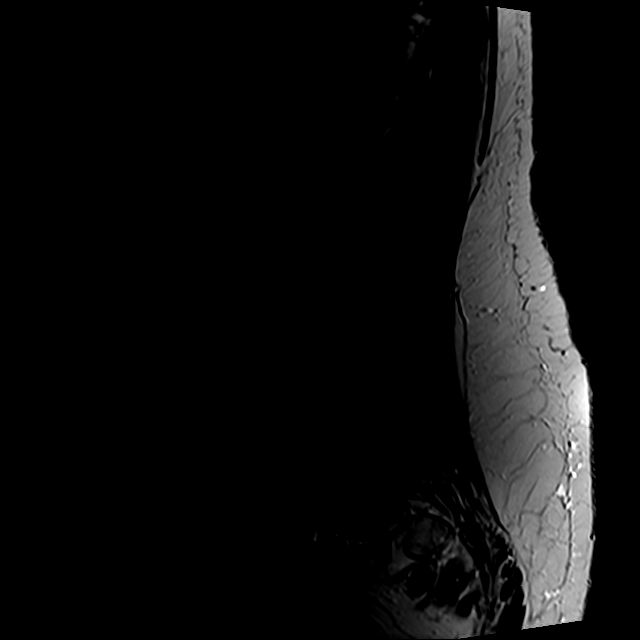
[im 3/14]
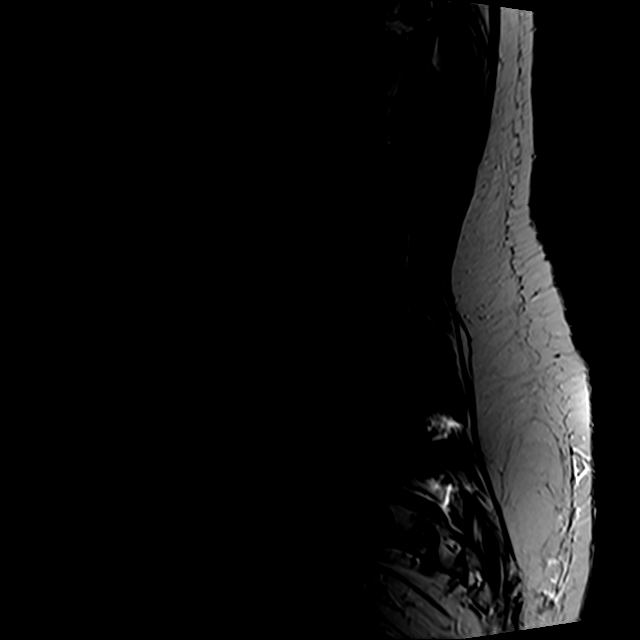
[im 5/14]
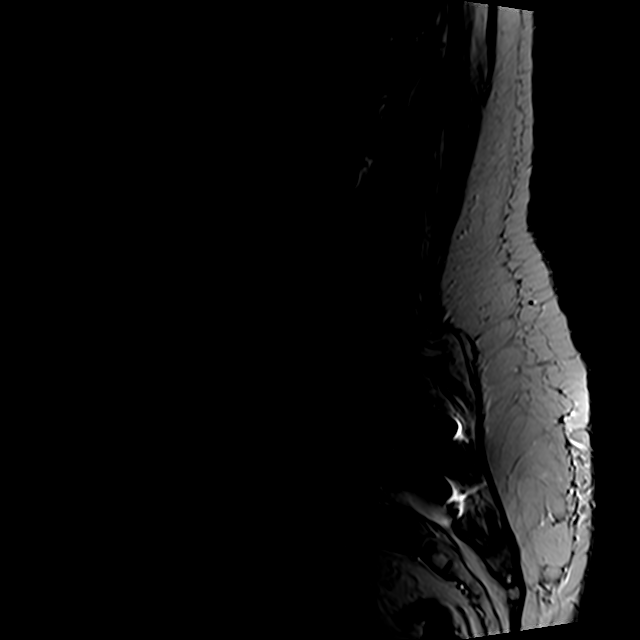
[im 7/14]
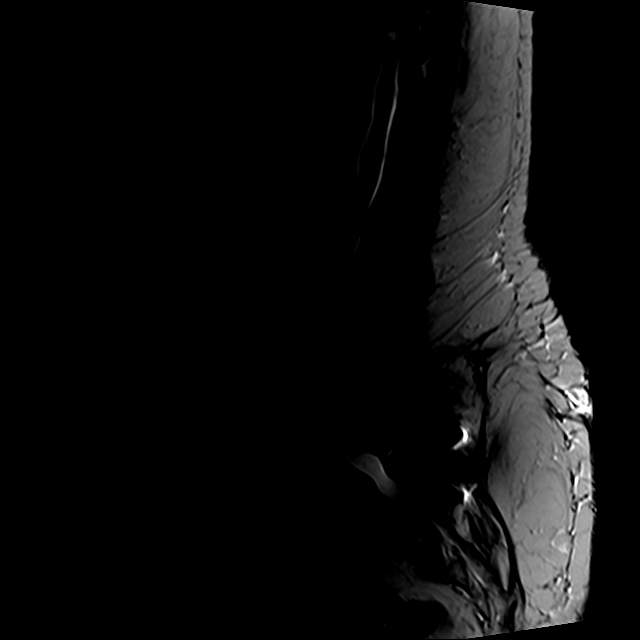
[im 9/14]
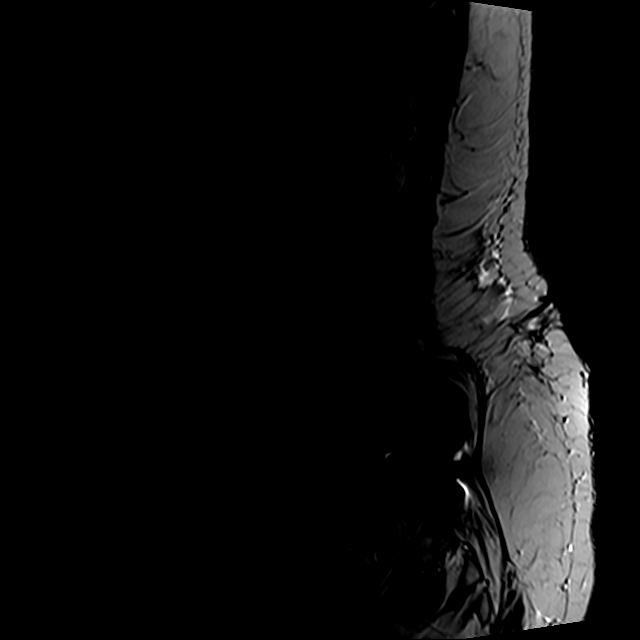
[im 11/14]
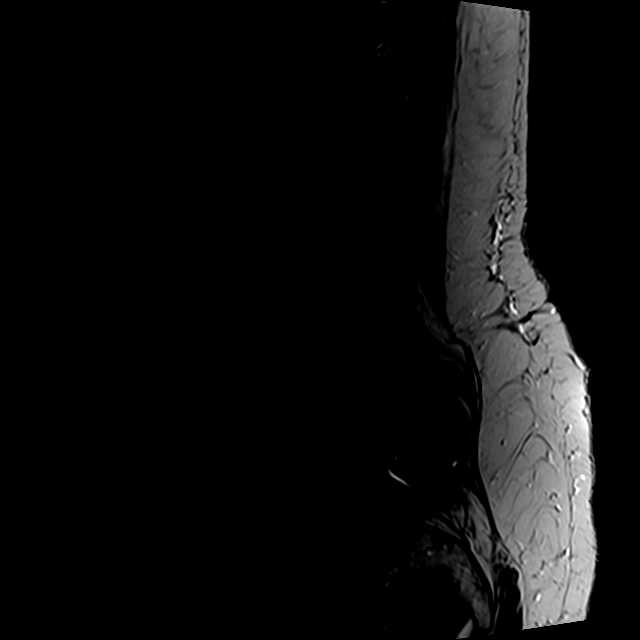
[im 14/14]
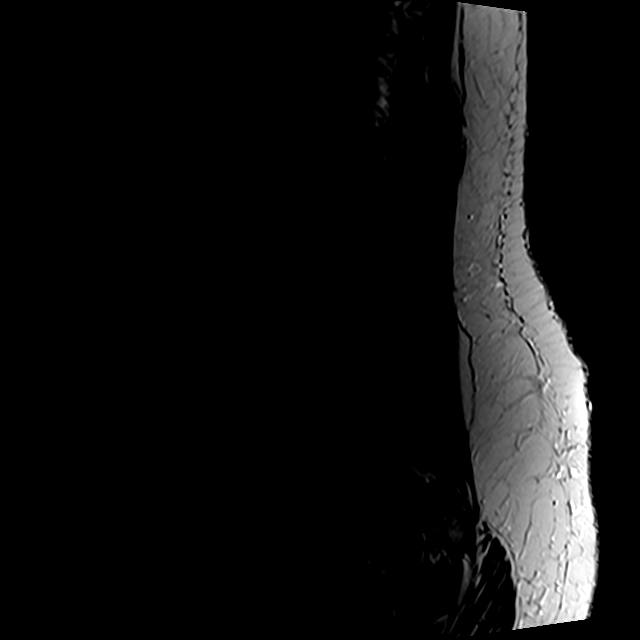

[Series 5: T2 · axial · 4.0mm · 0.37mm/px · z∈[-80,+38]mm · 3 of 31 slices shown (2 of 2)]
[im 5/31]
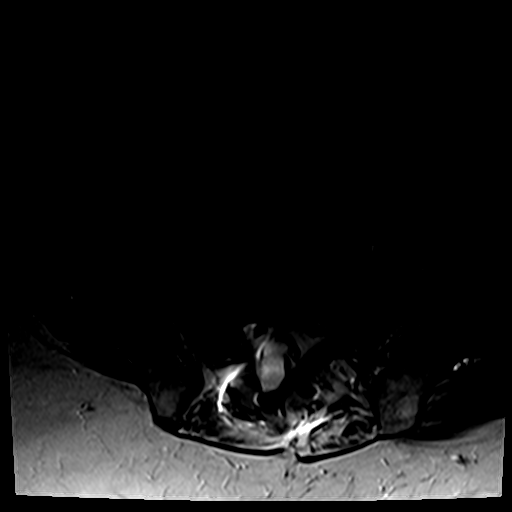
[im 17/31]
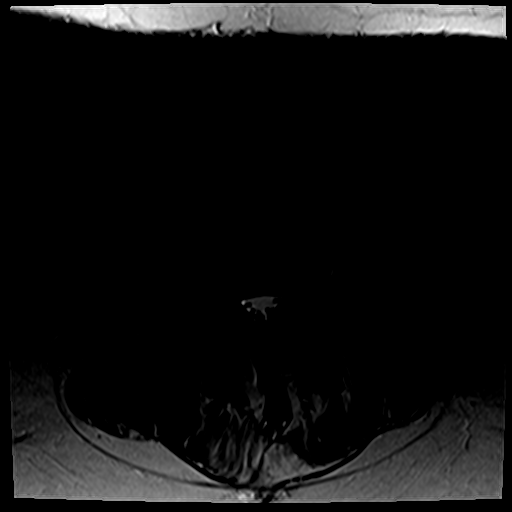
[im 26/31]
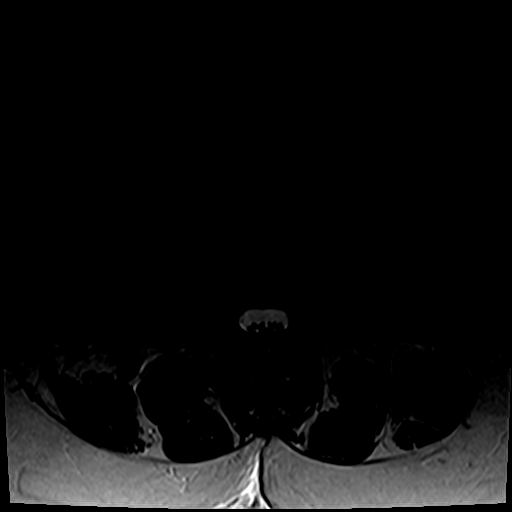

[Series 7: T1 · axial · 4.0mm · 0.37mm/px · z∈[-80,+38]mm · 3 of 31 slices shown (2 of 2)]
[im 5/31]
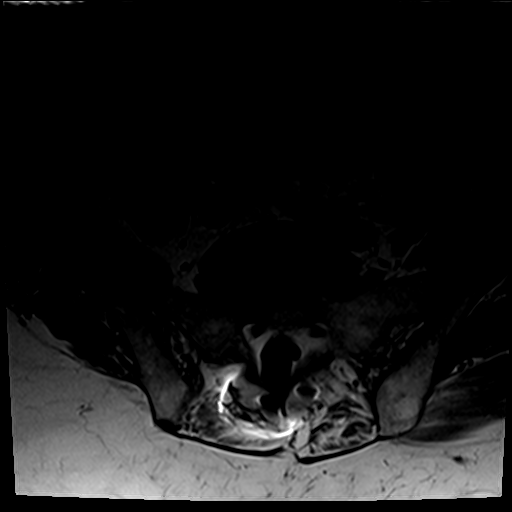
[im 17/31]
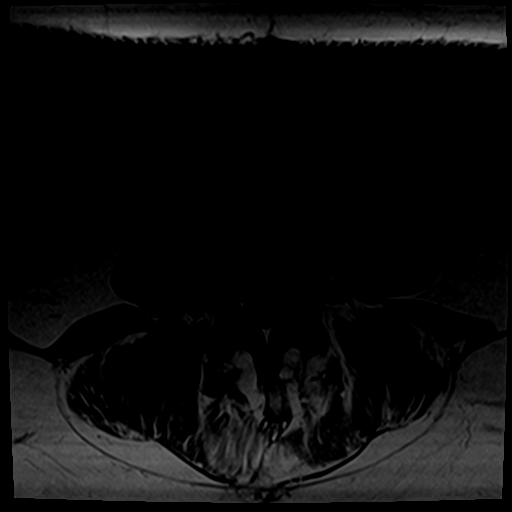
[im 26/31]
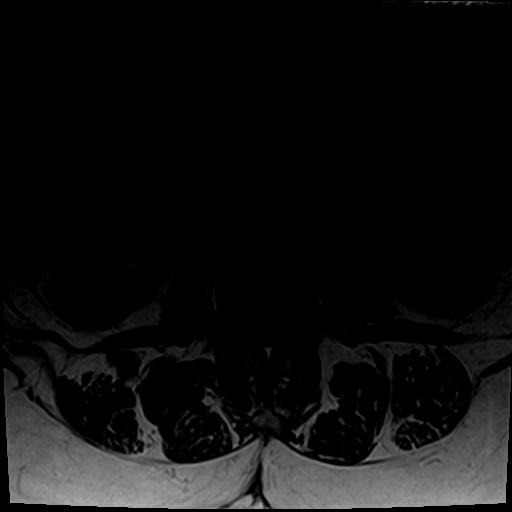

[16 of 48 positions shown; findings below may reference images not displayed]

FINDINGS: Segmentation: Normal.

Alignment: Grade 2 anterolisthesis L4 on L5 measures 10 mm on
sagittal imaging.

Vertebrae: No worrisome osseous lesion.There is signal dropout due
to susceptibility artifact from some type of previous surgical
procedure posteriorly at L5-S1. It is unclear if this is an
interspinous fixation device as I have no correlative plain films.

Conus medullaris: Normal in size, and signal. Conus tip abnormally
low ending inferior aspect of L2. No evidence for occult spinal
dysraphism.

Paraspinal tissues: No evidence for hydronephrosis or paravertebral
mass.

Disc levels:

L1-L2:  Mild bulge.  Mild facet arthropathy.  No impingement.

L2-L3: Central and leftward protrusion. Mild facet arthropathy. Mild
central canal stenosis. LEFT subarticular zone and foraminal zone
narrowing. LEFT L3 nerve root impingement is possible.

L3-L4: Shallow central protrusion. Mild central canal stenosis. Mild
or facet and moderate ligamentum flavum hypertrophy. BILATERAL
subarticular zone narrowing and foraminal zone narrowing could
affect the L3 and L4 nerve roots.

L4-L5: 10 mm anterolisthesis. Discussion covering with bulging into
the canal. Severe facet and ligamentum flavum hypertrophy. Critical
spinal stenosis. BILATERAL subarticular zone and foraminal zone
narrowing affects the L4 and L5 nerve roots. It is unclear if there
has been prior laminectomy; if so, there is regrowth of the
posterior elements.

L5-S1: Central protrusion. Artifact from some type of metallic
hardware. Facet arthropathy. Unclear status of possible fusion. Mild
stenosis is suspected. Subarticular zone and foraminal zone
narrowing bilaterally could affect the L5 and S1 nerve roots.

Recommend correlation with previous imaging studies if available.
Due to artifactual signal loss at L5-S1, if further investigation is
desired with regard to integrity of hardware or presence or absence
of fusion at L5-S1, CT myelography of lumbar spine could be helpful
in further evaluation, at which time standing flexion extension
views with attention to L4-5 could be obtained.
IMPRESSION: The patient's neurogenic claudication clearly derives from
significant pathology at L4-5. There is 10 mm grade 2
anterolisthesis. Severe facet arthropathy is evident with
subarticular zone and foraminal zone narrowing. It is unclear if
there has been surgery at this level but if so, there is regrowth of
posterior elements.

Some type of surgical procedure at L5-S1 with considerable artifact.
It is unclear if there is arthrodesis across this level. Central
protrusion and BILATERAL foraminal narrowing contributes to mild
stenosis and neural impingement as described above.

Less impressive disc disease at L2-3 and L3-4 although symptomatic
neural impingement at these levels not excluded. Certainly no
high-grade stenosis however.

Normal segmentation, without signs of occult spinal dysraphism, but
conus could is abnormally low, ending at inferior L2. Attention to
this detail is necessary if myelography performed.

## 2018-06-24 ENCOUNTER — Ambulatory Visit (INDEPENDENT_AMBULATORY_CARE_PROVIDER_SITE_OTHER): Payer: Medicare Other | Admitting: Specialist

## 2018-06-26 ENCOUNTER — Encounter (INDEPENDENT_AMBULATORY_CARE_PROVIDER_SITE_OTHER): Payer: Self-pay | Admitting: Specialist

## 2018-06-26 ENCOUNTER — Ambulatory Visit (INDEPENDENT_AMBULATORY_CARE_PROVIDER_SITE_OTHER): Payer: Medicare HMO

## 2018-06-26 ENCOUNTER — Ambulatory Visit (INDEPENDENT_AMBULATORY_CARE_PROVIDER_SITE_OTHER): Payer: Medicare Other | Admitting: Specialist

## 2018-06-26 VITALS — BP 131/72 | HR 52 | Ht 61.0 in | Wt 164.0 lb

## 2018-06-26 DIAGNOSIS — J9811 Atelectasis: Secondary | ICD-10-CM

## 2018-06-26 DIAGNOSIS — M4316 Spondylolisthesis, lumbar region: Secondary | ICD-10-CM

## 2018-06-26 DIAGNOSIS — M48062 Spinal stenosis, lumbar region with neurogenic claudication: Secondary | ICD-10-CM

## 2018-06-26 MED ORDER — METHYLPREDNISOLONE 4 MG PO TBPK: ORAL_TABLET | ORAL | 0 refills | Status: DC

## 2018-06-26 MED ORDER — METHYLPREDNISOLONE 4 MG PO TBPK: ORAL_TABLET | ORAL | 0 refills | Status: AC

## 2018-06-26 NOTE — Progress Notes (Addendum)
Office Visit Note   Patient: Caroline LappingBernice Ware           Date of Birth: 1933/03/16           MRN: [id number] Visit Date: 06/26/2018              Requested by: No referring provider defined for this encounter. PCP: System, Provider Not In   Assessment & Plan: Visit Diagnoses:  1. Spinal stenosis, lumbar region, with neurogenic claudication   2. Atelectasis   3. Spondylolisthesis of lumbar region   Body mass index is 30.99 kg/m.    Plan:Avoid bending, stooping and avoid lifting weights greater than 10 lbs. Avoid prolong standing and walking. Avoid frequent bending and stooping  No lifting greater than 10 lbs. May use ice or moist heat for pain. Weight loss is of benefit. Handicap license is approved. Try gabapentin one tablet at night for 3 days if need to increase then go to 2 tablets po at night. Stationary bicycle or pool walking program Medrol dose pak 6 day.  Flexion exercises.    Follow-Up Instructions: No follow-ups on file.   Orders:  Orders Placed This Encounter  Procedures  . XR Lumbar Spine 2-3 Views   No orders of the defined types were placed in this encounter.     Procedures: No procedures performed   Clinical Data: No additional findings.   Subjective: Chief Complaint  Patient presents with  . Lower Back - Pain  . Left Leg - Pain    [age] year old female with history of L4-5 fusion in 05/2015 above previous coflex interspinous device for neurogenic  Claudication. She did well for 2 years but is having recurrent pain in to the left anterior thigh and groin with standing and walking. Able to grocery shop with difference with [employer] shoes with the balance.    Review of Systems  Constitutional: Negative.   HENT: Positive for congestion, rhinorrhea, sinus pressure and sinus pain.   Eyes: Negative.   Respiratory: Negative.   Cardiovascular: Negative.   Gastrointestinal: Negative.   Endocrine: Negative.   Genitourinary: Negative.     Musculoskeletal: Negative.   Skin: Negative.   Allergic/Immunologic: Negative.   Neurological: Negative.   Hematological: Negative.   Psychiatric/Behavioral: Negative.      Objective: Vital Signs: BP 131/72 (BP Location: Left Arm, Patient Position: Sitting)   Pulse (!) 52   Ht 5\' 1"  (1.549 m)   Wt 164 lb (74.4 kg)   BMI 30.99 kg/m   Physical Exam  Constitutional: She is oriented to person, place, and time. She appears well-developed and well-nourished.  HENT:  Head: Normocephalic and atraumatic.  Eyes: Pupils are equal, round, and reactive to light. EOM are normal.  Neck: Normal range of motion. Neck supple.  Pulmonary/Chest: Effort normal and breath sounds normal.  Abdominal: Soft. Bowel sounds are normal.  Neurological: She is alert and oriented to person, place, and time.  Skin: Skin is warm and dry.  Psychiatric: She has a normal mood and affect. Her behavior is normal. Judgment and thought content normal.    Back Exam   Range of Motion  Extension:  50 abnormal  Flexion: normal  Lateral bend right: normal  Lateral bend left: normal  Rotation right: normal  Rotation left: normal   Tests  Straight leg raise right: negative Straight leg raise left: negative  Reflexes  Patellar:  4/4 normal Achilles:  4/4 normal Biceps:  Hyporeflexic abnormal Babinski's sign: normal   Other  Toe  walk: normal Heel walk: normal Sensation: normal      Specialty Comments:  No specialty comments available.  Imaging: No results found.   PMFS History: Patient Active Problem List   Diagnosis Date Noted  . Postoperative anemia due to acute blood loss 05/27/2015    Priority: High    Class: Acute  . Infection of urinary tract 05/27/2015    Priority: High    Class: Acute  . Spinal stenosis, lumbar region, with neurogenic claudication 05/25/2015    Priority: High    Class: Chronic  . Spondylolisthesis of lumbar region 05/25/2015    Priority: High    Class: Chronic   . Presence of retained hardware 05/25/2015    Priority: High    Class: Chronic  . Atelectasis 05/27/2015    Priority: Medium    Class: Acute  . Acute blood loss anemia 06/01/2015   Past Medical History:  Diagnosis Date  . Arthritis   . Diverticulitis   . GERD (gastroesophageal reflux disease)   . Hypertension    take Losartan  . Hypothyroidism    takes synthroid  . Seasonal allergies   . Shortness of breath dyspnea    with exertion  . Stroke [facility])     History reviewed. No pertinent family history.  Past Surgical History:  Procedure Laterality Date  . BACK SURGERY    . BUNIONECTOMY    . CARDIAC CATHETERIZATION    . CHOLECYSTECTOMY    . colonoscopy    . hand Right   . LUMBAR FUSION N/A 05/25/2015   Procedure: LUMBAR DECOMPRESSIVE LAMINECTOMY L3-4 AND L4-5, TRANSFORAMINAL LUMBAR INTERBODY FUSION L4-5;  Surgeon: Kerrin Champagne, MD;  Location: [facility] OR;  Service: Orthopedics;  Laterality: N/A;  . ROTATOR CUFF REPAIR Bilateral    Social History   Occupational History  . Not on file  Tobacco Use  . Smoking status: Never Smoker  . Smokeless tobacco: Never Used  Substance and Sexual Activity  . Alcohol use: Yes    Comment: occasionally  . Drug use: No  . Sexual activity: Not Currently

## 2018-06-26 NOTE — Patient Instructions (Addendum)
Avoid bending, stooping and avoid lifting weights greater than 10 lbs. Avoid prolong standing and walking. Avoid frequent bending and stooping  No lifting greater than 10 lbs. May use ice or moist heat for pain. Weight loss is of benefit. Handicap license is approved. Try gabapentin one tablet at night for 3 days if need to increase then go to 2 tablets po at night. Stationary bicycle or pool walking program Medrol dose pak 6 day.  Flexion exercises.

## 2018-07-05 ENCOUNTER — Telehealth (INDEPENDENT_AMBULATORY_CARE_PROVIDER_SITE_OTHER): Payer: Self-pay | Admitting: Specialist

## 2018-07-05 NOTE — Telephone Encounter (Signed)
So [facility], then she decided that she wanted it to go to [facility].  She states that when she went to get the rx that she had to [facility] to [facility] to [facility].  She states that se had her daughter to go [facility] and get her other meds and states that the same meds were there.  I advised her not to take it now but to hold it so if she needs it later that she can just use it later.  She states that she understands this.

## 2018-07-05 NOTE — Telephone Encounter (Signed)
Patient called left voicemail with no detail as to why she was calling other than she wanted you to call her back.  Please call patient per vm

## 2018-07-31 ENCOUNTER — Ambulatory Visit (INDEPENDENT_AMBULATORY_CARE_PROVIDER_SITE_OTHER): Payer: Medicare HMO | Admitting: Specialist

## 2021-08-04 ENCOUNTER — Encounter (HOSPITAL_COMMUNITY): Payer: Self-pay | Admitting: *Deleted

## 2021-08-04 ENCOUNTER — Other Ambulatory Visit: Payer: Self-pay

## 2021-08-04 ENCOUNTER — Emergency Department (HOSPITAL_COMMUNITY)
Admission: EM | Admit: 2021-08-04 | Discharge: 2021-08-04 | Disposition: A | Discharge status: Home / Self Care | Payer: Medicare HMO | Attending: Emergency Medicine | Admitting: Emergency Medicine

## 2021-08-04 ENCOUNTER — Emergency Department (HOSPITAL_COMMUNITY): Payer: Medicare HMO

## 2021-08-04 DIAGNOSIS — Z8673 Personal history of transient ischemic attack (TIA), and cerebral infarction without residual deficits: Secondary | ICD-10-CM | POA: Diagnosis not present

## 2021-08-04 DIAGNOSIS — I10 Essential (primary) hypertension: Secondary | ICD-10-CM | POA: Diagnosis not present

## 2021-08-04 DIAGNOSIS — K625 Hemorrhage of anus and rectum: Secondary | ICD-10-CM | POA: Diagnosis not present

## 2021-08-04 DIAGNOSIS — Z91010 Allergy to peanuts: Secondary | ICD-10-CM | POA: Insufficient documentation

## 2021-08-04 DIAGNOSIS — E039 Hypothyroidism, unspecified: Secondary | ICD-10-CM | POA: Insufficient documentation

## 2021-08-04 DIAGNOSIS — Z7902 Long term (current) use of antithrombotics/antiplatelets: Secondary | ICD-10-CM | POA: Insufficient documentation

## 2021-08-04 DIAGNOSIS — R030 Elevated blood-pressure reading, without diagnosis of hypertension: Secondary | ICD-10-CM | POA: Diagnosis present

## 2021-08-04 DIAGNOSIS — R001 Bradycardia, unspecified: Secondary | ICD-10-CM

## 2021-08-04 DIAGNOSIS — Z79899 Other long term (current) drug therapy: Secondary | ICD-10-CM | POA: Insufficient documentation

## 2021-08-04 DIAGNOSIS — D649 Anemia, unspecified: Secondary | ICD-10-CM | POA: Insufficient documentation

## 2021-08-04 LAB — COMPREHENSIVE METABOLIC PANEL
ALT: 15 U/L (ref 0–44)
AST: 25 U/L (ref 15–41)
Albumin: 4.1 g/dL (ref 3.5–5.0)
Alkaline Phosphatase: 48 U/L (ref 38–126)
Anion gap: 8 (ref 5–15)
BUN: 18 mg/dL (ref 8–23)
CO2: 26 mmol/L (ref 22–32)
Calcium: 9.8 mg/dL (ref 8.9–10.3)
Chloride: 105 mmol/L (ref 98–111)
Creatinine, Ser: 1.19 mg/dL — ABNORMAL HIGH (ref 0.44–1.00)
GFR, Estimated: 45 mL/min — ABNORMAL LOW (ref >60)
Glucose, Bld: 97 mg/dL (ref 70–99)
Potassium: 3.7 mmol/L (ref 3.5–5.1)
Sodium: 139 mmol/L (ref 135–145)
Total Bilirubin: 0.7 mg/dL (ref 0.3–1.2)
Total Protein: 7.8 g/dL (ref 6.5–8.1)

## 2021-08-04 LAB — CBC WITH DIFFERENTIAL/PLATELET
Abs Immature Granulocytes: 0.01 10*3/uL (ref 0.00–0.07)
Basophils Absolute: 0 10*3/uL (ref 0.0–0.1)
Basophils Relative: 1 %
Eosinophils Absolute: 0.1 10*3/uL (ref 0.0–0.5)
Eosinophils Relative: 1 %
HCT: 35.7 % — ABNORMAL LOW (ref 36.0–46.0)
Hemoglobin: 11 g/dL — ABNORMAL LOW (ref 12.0–15.0)
Immature Granulocytes: 0 %
Lymphocytes Relative: 18 %
Lymphs Abs: 0.9 10*3/uL (ref 0.7–4.0)
MCH: 27.5 pg (ref 26.0–34.0)
MCHC: 30.8 g/dL (ref 30.0–36.0)
MCV: 89.3 fL (ref 80.0–100.0)
Monocytes Absolute: 0.3 10*3/uL (ref 0.1–1.0)
Monocytes Relative: 7 %
Neutro Abs: 3.7 10*3/uL (ref 1.7–7.7)
Neutrophils Relative %: 73 %
Platelets: 210 10*3/uL (ref 150–400)
RBC: 4 MIL/uL (ref 3.87–5.11)
RDW: 17.2 % — ABNORMAL HIGH (ref 11.5–15.5)
WBC: 5 10*3/uL (ref 4.0–10.5)
nRBC: 0 % (ref 0.0–0.2)

## 2021-08-04 LAB — TYPE AND SCREEN
ABO/RH(D): A NEG
Antibody Screen: POSITIVE

## 2021-08-04 LAB — PROTIME-INR
INR: 1 (ref 0.8–1.2)
Prothrombin Time: 13.3 seconds (ref 11.4–15.2)

## 2021-08-04 LAB — POC OCCULT BLOOD, ED: Fecal Occult Bld: NEGATIVE

## 2021-08-04 MED ORDER — SODIUM CHLORIDE 0.9 % IV SOLN: INTRAVENOUS | Status: DC

## 2021-08-04 NOTE — Discharge Instructions (Signed)
Stop the Clonidine.  Take hydralazine 100 mg in the morning and at night and 50 mg at noon.

## 2021-08-04 NOTE — ED Triage Notes (Signed)
Pt saw PCP, who did test and found blood in stool yesterday. She was supposed to have colonoscopy but she has not heard back and would like to be evaluated.

## 2021-08-04 NOTE — ED Notes (Signed)
DG in room. Will do EKG when they finish.

## 2021-08-04 NOTE — ED Provider Notes (Signed)
[facility]-EMERGENCY DEPT Provider Note   CSN: [id number] Arrival date & time: 08/04/21  7253     History Chief Complaint  Patient presents with   Rectal Bleeding    Caroline Ware is a 87 y.o. female.  Pt presents to the ED today with multiple complaints.  She is concerned about her blood pressure.  She was in the ED on 8/19 and 8/19 (both at [facility] in [place]) for her htn.  She was told to take 100 mg hydralazine aqm and qpm and take 50 mg midday.  She was also told to take clonidine if her bp is more than 150.  She has not been taking the mid day hydralazine.  She also takes labetalol bid for her bp.  Pt did f/u with her doctor's office on 8/23.  No medication adjustments were noted.  She did have a stool test (FIT) which showed blood in her stool.  Her doctor's office has scheduled her for a colonoscopy.  She said she's been taking her bp meds as directed and her bp is up and down.  She brings me a copy of her measurements and is taking her bp multiple times a day and sometimes at 3 and 4 in the morning.  She is also worried about the blood in her stool.  She does still have a headache.         Past Medical History:  Diagnosis Date   Arthritis    Diverticulitis    GERD (gastroesophageal reflux disease)    Hypertension    take Losartan   Hypothyroidism    takes synthroid   Seasonal allergies    Shortness of breath dyspnea    with exertion   Stroke [facility])     Patient Active Problem List   Diagnosis Date Noted   Acute blood loss anemia 06/01/2015   Postoperative anemia due to acute blood loss 05/27/2015    Class: Acute   Infection of urinary tract 05/27/2015    Class: Acute   Atelectasis 05/27/2015    Class: Acute   Spinal stenosis, lumbar region, with neurogenic claudication 05/25/2015    Class: Chronic   Spondylolisthesis of lumbar region 05/25/2015    Class: Chronic   Presence of retained hardware 05/25/2015    Class: Chronic     Past Surgical History:  Procedure Laterality Date   BACK SURGERY     BUNIONECTOMY     CARDIAC CATHETERIZATION     CHOLECYSTECTOMY     colonoscopy     hand Right    LUMBAR FUSION N/A 05/25/2015   Procedure: LUMBAR DECOMPRESSIVE LAMINECTOMY L3-4 AND L4-5, TRANSFORAMINAL LUMBAR INTERBODY FUSION L4-5;  Surgeon: Kerrin Champagne, MD;  Location: [facility] OR;  Service: Orthopedics;  Laterality: N/A;   ROTATOR CUFF REPAIR Bilateral      OB History   No obstetric history on file.     No family history on file.  Social History   Tobacco Use   Smoking status: Never   Smokeless tobacco: Never  Substance Use Topics   Alcohol use: Yes    Comment: occasionally   Drug use: No    Home Medications Prior to Admission medications   Medication Sig Start Date End Date Taking? Authorizing Provider  calcium carbonate (OS-CAL) 600 MG TABS tablet Take 600 mg by mouth 2 (two) times daily with a meal.   Yes [provider]  cloNIDine (CATAPRES) 0.1 MG tablet Take 0.1 mg by mouth every 6 (six) hours as  needed. Increased blood pressure 08/02/21  Yes [provider]  clopidogrel (PLAVIX) 75 MG tablet Take 1 tablet (75 mg total) by mouth daily. 05/28/15  Yes Kerrin ChampagneNitka, James E, MD  COMBIGAN 0.2-0.5 % ophthalmic solution Place 1 drop into both eyes every 12 (twelve) hours.  03/08/15  Yes [provider]  famotidine (PEPCID) 40 MG tablet Take 40 mg by mouth daily. 06/28/21  Yes [provider]  hydrALAZINE (APRESOLINE) 100 MG tablet Take 100 mg by mouth 2 (two) times daily. 08/01/21  Yes [provider]  labetalol (NORMODYNE) 100 MG tablet Take 100 mg by mouth 2 (two) times daily. 07/01/21  Yes [provider]  loratadine (CLARITIN) 10 MG tablet Take 10 mg by mouth daily.   Yes [provider]  LUMIGAN 0.01 % SOLN Place 1 drop into both eyes at bedtime.  04/28/15  Yes [provider]  montelukast (SINGULAIR) 10 MG tablet Take 10 mg by mouth at  bedtime. 01/30/18  Yes [provider]  Multiple Vitamins-Minerals (SENIOR MULTIVITAMIN PLUS PO) Take 1 tablet by mouth daily.   Yes [provider]  potassium chloride SA (K-DUR,KLOR-CON) 20 MEQ tablet Take 20 mEq by mouth daily. 02/11/15  Yes [provider]  pravastatin (PRAVACHOL) 20 MG tablet Take 20 mg by mouth daily after breakfast.  03/08/15  Yes [provider]  SYNTHROID 50 MCG tablet Take 50 mcg by mouth daily before breakfast.  04/02/15  Yes [provider]  triamcinolone cream (KENALOG) 0.1 % Apply 1 application topically 3 (three) times daily as needed for rash. 06/29/21  Yes [provider]  Biotin w/ Vitamins C & E (HAIR SKIN & NAILS GUMMIES PO) Take 1 tablet by mouth daily.    [provider]  methylPREDNISolone (MEDROL DOSEPAK) 4 MG TBPK tablet Take as directed. Patient not taking: No sig reported 06/26/18   Kerrin ChampagneNitka, James E, MD  pantoprazole (PROTONIX) 40 MG tablet TAKE 1 TABLET BY MOUTH EVERY DAY Patient not taking: No sig reported 05/20/18   [provider]  Current meds: Medication Sig Dispense Refill   pravastatin (PRAVACHOL) 20 mg Tablet take 1 tablet every night by mouth . 90 tablet 0   famotidine (PEPCID) 40 mg Tablet TAKE 1 TABLET BY MOUTH EVERY DAY FOR STOMACH 90 tablet 2   labetaloL (NORMODYNE) 100 mg Tablet take 1 tablet in the morning and 1 tablet before bedtime by mouth. 180 tablet 4   triamcinolone acetonide (KENALOG) 0.1 % Cream 1 Application three times daily by Topical route . 30 g 4   clopidogreL (PLAVIX) 75 mg Tablet TAKE 1 TABLET BY MOUTH EVERY DAY 90 tablet 4   hydrALAZINE 100 mg Tablet TAKE 100 MG BY MOUTH TWO TIMES DAILY FOR BLOOD PRESSURE 180 tablet 3   levothyroxine (SYNTHROID) 50 mcg Tablet TAKE 1 TABLET BY MOUTH EVERY DAY 90 tablet 1   montelukast (SINGULAIR) 10 mg Tablet TAKE 1 TAB BY MOUTH EVERY DAY FOR COUGH AND CONGESTION 90 tablet 3   ondansetron HCL (ZOFRAN) 4 mg Tablet take 1 tablet  every 8 hours as needed by mouth for Nausea. 20 tablet 0   polyethylene glycol (MIRALAX) powder take 17 g by mouth every morning Mix 1 capful (17 grams) in 8 ounces of water, juice, or milk. 510 g 0   calcium-vitamin D (OSCAL 500 WITH D) 500 mg(1,250mg ) -200 unit Tablet take 2 tablets by mouth every day 100 tablet PRN   RHOPRESSA 0.02 % Drops apply 1 drop every night  to eye both eyes.   Calcium take 300 mEq in the morning AND 300 mEq before bedtime by mouth.   loratadine (CLARITIN) 10 mg Tablet take 1 Tab by mouth every day 30 Tab 3   Brimonidine-Timolol 0.2-0.5 % Drops apply 1 drop to eye two times daily   LUMIGAN 0.01 % Drops Apply 1 drop each eye every night   Geriatric Multivitamins-Min PO Tab 1 by mouth each day   Allergies    Betadine [povidone iodine], Ciprofloxacin, Iodine, Metronidazole, Penicillins, Doxycycline, Hydrocodone, Actical, Bee venom, Bextra [valdecoxib], Esomeprazole, Fish allergy, Honey, Ivp dye [iodinated diagnostic agents], Nexium [esomeprazole magnesium], Oxycodone-acetaminophen, Peanuts [peanut oil], Tetanus toxoids, Tetracycline, and Tramadol  Review of Systems   Review of Systems  Gastrointestinal:  Positive for blood in stool.  Neurological:  Positive for headaches.  All other systems reviewed and are negative.  Physical Exam Updated Vital Signs BP (!) 170/59   Pulse (!) 44   Temp 98.2 F (36.8 C) (Oral)   Resp 17   SpO2 99%   Physical Exam Vitals and nursing note reviewed.  Constitutional:      Appearance: Normal appearance.  HENT:     Head: Normocephalic and atraumatic.     Right Ear: External ear normal.     Left Ear: External ear normal.     Nose: Nose normal.     Mouth/Throat:     Mouth: Mucous membranes are moist.     Pharynx: Oropharynx is clear.  Eyes:     Extraocular Movements: Extraocular movements intact.     Conjunctiva/sclera: Conjunctivae normal.     Pupils: Pupils are equal, round, and reactive to light.  Cardiovascular:      Rate and Rhythm: Regular rhythm. Bradycardia present.     Pulses: Normal pulses.     Heart sounds: Normal heart sounds.  Pulmonary:     Effort: Pulmonary effort is normal.     Breath sounds: Normal breath sounds.  Abdominal:     General: Abdomen is flat. Bowel sounds are normal.     Palpations: Abdomen is soft.  Musculoskeletal:        General: Normal range of motion.     Cervical back: Normal range of motion and neck supple.  Skin:    General: Skin is warm.     Capillary Refill: Capillary refill takes less than 2 seconds.  Neurological:     General: No focal deficit present.     Mental Status: She is alert and oriented to person, place, and time.  Psychiatric:        Mood and Affect: Mood normal.        Behavior: Behavior normal.        Thought Content: Thought content normal.        Judgment: Judgment normal.    ED Results / Procedures / Treatments   Labs (all labs ordered are listed, but only abnormal results are displayed) Labs Reviewed  COMPREHENSIVE METABOLIC PANEL - Abnormal; Notable for the following components:      Result Value   Creatinine, Ser 1.19 (*)    GFR, Estimated 45 (*)    All other components within normal limits  CBC WITH DIFFERENTIAL/PLATELET - Abnormal; Notable for the following components:   Hemoglobin 11.0 (*)    HCT 35.7 (*)    RDW 17.2 (*)    All other components within normal limits  PROTIME-INR  POC OCCULT BLOOD, ED  TYPE AND SCREEN    EKG EKG Interpretation  Date/Time:  Thursday August 04 2021 09:47:07 EDT Ventricular Rate:  42 PR Interval:  157 QRS Duration: 85 QT Interval:  508 QTC Calculation: 425 R Axis:   80 Text Interpretation: Sinus bradycardia Baseline wander in lead(s) V3 Since last tracing rate slower Confirmed by Jacalyn Lefevre [phone]) on 08/04/2021 10:00:57 AM  Radiology DG Chest Port 1 View  Result Date: 08/04/2021 CLINICAL DATA:  Blood in stool. EXAM: PORTABLE CHEST 1 VIEW COMPARISON:  05/27/2015 FINDINGS: 0939  hours. The cardio pericardial silhouette is enlarged. The lungs are clear without focal pneumonia, edema, pneumothorax or pleural effusion. The visualized bony structures of the thorax show no acute abnormality. IMPRESSION: No acute cardiopulmonary findings. Electronically Signed   By: Kennith Center M.D.   On: 08/04/2021 10:08    Procedures Procedures   Medications Ordered in ED Medications  0.9 %  sodium chloride infusion (has no administration in time range)    ED Course  I have reviewed the triage vital signs and the nursing notes.  Pertinent labs & imaging results that were available during my care of the patient were reviewed by me and considered in my medical decision making (see chart for details).    MDM Rules/Calculators/A&P                           Pt is encouraged to stop taking her bp multiple times a day; especially in the middle of the night.    I am going to stop the clonidine.  This is just causing her bp to go down when she takes it, then back up when it wears off.  HR is low, so she can't take additional ccb or bb.    Pt is told to take the hydralazine 100 mg qam and qpm and 50 mg at noon.    As far as the rectal bleeding goes, her hgb is 11.0.  MCV is nl.  She has no bleeding now and hgb is better than 10.2 on 8/23.  No indication for emergent colonoscopy.  Pt is to f/u with pcp.  Return if worse.  Final Clinical Impression(s) / ED Diagnoses Final diagnoses:  HTN (hypertension)  Anemia, unspecified type  Bradycardia    Rx / DC Orders ED Discharge Orders     None        Jacalyn Lefevre, MD 08/04/21 1137

## 2021-08-10 ENCOUNTER — Other Ambulatory Visit: Payer: Self-pay

## 2021-08-10 ENCOUNTER — Emergency Department (HOSPITAL_COMMUNITY)
Admission: EM | Admit: 2021-08-10 | Discharge: 2021-08-11 | Disposition: A | Discharge status: Home / Self Care | Payer: Medicare HMO | Attending: Emergency Medicine | Admitting: Emergency Medicine

## 2021-08-10 DIAGNOSIS — R519 Headache, unspecified: Secondary | ICD-10-CM | POA: Diagnosis not present

## 2021-08-10 DIAGNOSIS — Z79899 Other long term (current) drug therapy: Secondary | ICD-10-CM | POA: Insufficient documentation

## 2021-08-10 DIAGNOSIS — E039 Hypothyroidism, unspecified: Secondary | ICD-10-CM | POA: Diagnosis not present

## 2021-08-10 DIAGNOSIS — I10 Essential (primary) hypertension: Secondary | ICD-10-CM | POA: Insufficient documentation

## 2021-08-10 DIAGNOSIS — R200 Anesthesia of skin: Secondary | ICD-10-CM | POA: Insufficient documentation

## 2021-08-10 DIAGNOSIS — Z5321 Procedure and treatment not carried out due to patient leaving prior to being seen by health care provider: Secondary | ICD-10-CM | POA: Insufficient documentation

## 2021-08-10 DIAGNOSIS — Z7902 Long term (current) use of antithrombotics/antiplatelets: Secondary | ICD-10-CM | POA: Diagnosis not present

## 2021-08-10 LAB — CBC
HCT: 34.9 % — ABNORMAL LOW (ref 36.0–46.0)
Hemoglobin: 11.1 g/dL — ABNORMAL LOW (ref 12.0–15.0)
MCH: 27.8 pg (ref 26.0–34.0)
MCHC: 31.8 g/dL (ref 30.0–36.0)
MCV: 87.3 fL (ref 80.0–100.0)
Platelets: 205 10*3/uL (ref 150–400)
RBC: 4 MIL/uL (ref 3.87–5.11)
RDW: 17 % — ABNORMAL HIGH (ref 11.5–15.5)
WBC: 5.8 10*3/uL (ref 4.0–10.5)
nRBC: 0 % (ref 0.0–0.2)

## 2021-08-10 NOTE — ED Triage Notes (Signed)
Pt was brought in by her daughter for high BP.  States was 200 systolic at the other hospital.  Pt's daughter states they were at another hospital tonight and had blood work but felt that were waiting all night and decided to leave and come here.

## 2021-08-11 ENCOUNTER — Emergency Department (HOSPITAL_COMMUNITY): Payer: Medicare HMO

## 2021-08-11 ENCOUNTER — Encounter (HOSPITAL_COMMUNITY): Payer: Self-pay | Admitting: Emergency Medicine

## 2021-08-11 ENCOUNTER — Emergency Department (HOSPITAL_COMMUNITY)
Admission: EM | Admit: 2021-08-11 | Discharge: 2021-08-11 | Disposition: A | Discharge status: Home / Self Care | Payer: Medicare HMO | Source: Home / Self Care | Attending: Emergency Medicine | Admitting: Emergency Medicine

## 2021-08-11 DIAGNOSIS — Z7902 Long term (current) use of antithrombotics/antiplatelets: Secondary | ICD-10-CM | POA: Insufficient documentation

## 2021-08-11 DIAGNOSIS — E039 Hypothyroidism, unspecified: Secondary | ICD-10-CM | POA: Insufficient documentation

## 2021-08-11 DIAGNOSIS — Z79899 Other long term (current) drug therapy: Secondary | ICD-10-CM | POA: Insufficient documentation

## 2021-08-11 DIAGNOSIS — R519 Headache, unspecified: Secondary | ICD-10-CM | POA: Insufficient documentation

## 2021-08-11 DIAGNOSIS — I10 Essential (primary) hypertension: Secondary | ICD-10-CM | POA: Insufficient documentation

## 2021-08-11 LAB — BASIC METABOLIC PANEL
Anion gap: 7 (ref 5–15)
BUN: 19 mg/dL (ref 8–23)
CO2: 27 mmol/L (ref 22–32)
Calcium: 9.9 mg/dL (ref 8.9–10.3)
Chloride: 100 mmol/L (ref 98–111)
Creatinine, Ser: 1.19 mg/dL — ABNORMAL HIGH (ref 0.44–1.00)
GFR, Estimated: 45 mL/min — ABNORMAL LOW (ref >60)
Glucose, Bld: 118 mg/dL — ABNORMAL HIGH (ref 70–99)
Potassium: 3.4 mmol/L — ABNORMAL LOW (ref 3.5–5.1)
Sodium: 134 mmol/L — ABNORMAL LOW (ref 135–145)

## 2021-08-11 MED ORDER — HYDRALAZINE HCL 25 MG PO TABS
100.0000 mg | ORAL_TABLET | Freq: Once | ORAL | Status: AC
  Administered 2021-08-11: 100 mg via ORAL
  Filled 2021-08-11: qty 4

## 2021-08-11 NOTE — ED Triage Notes (Signed)
Patient BIB daughter for increased BP. States seen last night with lab draw but left due to wait time. C/o tingling to bilateral feet.

## 2021-08-11 NOTE — ED Notes (Signed)
Family at bedside. 

## 2021-08-11 NOTE — ED Notes (Signed)
Discharge paperwork reviewed with pt and pts family member.  Pt with no questions or concerns at time of d/c.  NT wheeled pt to ED exit to meet ride.

## 2021-08-11 NOTE — ED Provider Notes (Signed)
[facility]-EMERGENCY DEPT Provider Note   CSN: [id number] Arrival date & time: 08/11/21  0631     History Chief Complaint  Patient presents with   Hypertension    Caroline LappingBernice Ballard is a 87 y.o. female.  The history is provided by the patient.  Hypertension This is a chronic problem. The problem occurs daily. Associated symptoms include headaches. Pertinent negatives include no chest pain, no abdominal pain and no shortness of breath. Nothing aggravates the symptoms. Nothing relieves the symptoms. She has tried nothing for the symptoms. The treatment provided no relief.      Past Medical History:  Diagnosis Date   Arthritis    Diverticulitis    GERD (gastroesophageal reflux disease)    Hypertension    take Losartan   Hypothyroidism    takes synthroid   Seasonal allergies    Shortness of breath dyspnea    with exertion   Stroke [facility](H[facility])     Patient Active Problem List   Diagnosis Date Noted   Acute blood loss anemia 06/01/2015   Postoperative anemia due to acute blood loss 05/27/2015    Class: Acute   Infection of urinary tract 05/27/2015    Class: Acute   Atelectasis 05/27/2015    Class: Acute   Spinal stenosis, lumbar region, with neurogenic claudication 05/25/2015    Class: Chronic   Spondylolisthesis of lumbar region 05/25/2015    Class: Chronic   Presence of retained hardware 05/25/2015    Class: Chronic    Past Surgical History:  Procedure Laterality Date   BACK SURGERY     BUNIONECTOMY     CARDIAC CATHETERIZATION     CHOLECYSTECTOMY     colonoscopy     hand Right    LUMBAR FUSION N/A 05/25/2015   Procedure: LUMBAR DECOMPRESSIVE LAMINECTOMY L3-4 AND L4-5, TRANSFORAMINAL LUMBAR INTERBODY FUSION L4-5;  Surgeon: Kerrin ChampagneJames E Nitka, MD;  Location: [facility] OR;  Service: Orthopedics;  Laterality: N/A;   ROTATOR CUFF REPAIR Bilateral      OB History   No obstetric history on file.     No family history on file.  Social History   Tobacco  Use   Smoking status: Never   Smokeless tobacco: Never  Substance Use Topics   Alcohol use: Yes    Comment: occasionally   Drug use: No    Home Medications Prior to Admission medications   Medication Sig Start Date End Date Taking? Authorizing Provider  Biotin w/ Vitamins C & E (HAIR SKIN & NAILS GUMMIES PO) Take 1 tablet by mouth daily.    [provider]  calcium carbonate (OS-CAL) 600 MG TABS tablet Take 600 mg by mouth 2 (two) times daily with a meal.    [provider]  cloNIDine (CATAPRES) 0.1 MG tablet Take 0.1 mg by mouth every 6 (six) hours as needed. Increased blood pressure 08/02/21   [provider]  clopidogrel (PLAVIX) 75 MG tablet Take 1 tablet (75 mg total) by mouth daily. 05/28/15   Kerrin ChampagneNitka, James E, MD  COMBIGAN 0.2-0.5 % ophthalmic solution Place 1 drop into both eyes every 12 (twelve) hours.  03/08/15   [provider]  famotidine (PEPCID) 40 MG tablet Take 40 mg by mouth daily. 06/28/21   [provider]  hydrALAZINE (APRESOLINE) 100 MG tablet Take 100 mg by mouth 2 (two) times daily. 08/01/21   [provider]  labetalol (NORMODYNE) 100 MG tablet Take 100 mg by mouth 2 (two) times daily. 07/01/21   [provider]  loratadine (CLARITIN) 10 MG tablet Take 10 mg by mouth daily.    [provider]  LUMIGAN 0.01 % SOLN Place 1 drop into both eyes at bedtime.  04/28/15   [provider]  methylPREDNISolone (MEDROL DOSEPAK) 4 MG TBPK tablet Take as directed. Patient not taking: No sig reported 06/26/18   Kerrin Champagne, MD  montelukast (SINGULAIR) 10 MG tablet Take 10 mg by mouth at bedtime. 01/30/18   [provider]  Multiple Vitamins-Minerals (SENIOR MULTIVITAMIN PLUS PO) Take 1 tablet by mouth daily.    [provider]  pantoprazole (PROTONIX) 40 MG tablet TAKE 1 TABLET BY MOUTH EVERY DAY Patient not taking: No sig reported 05/20/18   [provider]  potassium chloride SA  (K-DUR,KLOR-CON) 20 MEQ tablet Take 20 mEq by mouth daily. 02/11/15   [provider]  pravastatin (PRAVACHOL) 20 MG tablet Take 20 mg by mouth daily after breakfast.  03/08/15   [provider]  SYNTHROID 50 MCG tablet Take 50 mcg by mouth daily before breakfast.  04/02/15   [provider]  triamcinolone cream (KENALOG) 0.1 % Apply 1 application topically 3 (three) times daily as needed for rash. 06/29/21   [provider]    Allergies    Betadine [povidone iodine], Ciprofloxacin, Iodine, Metronidazole, Penicillins, Doxycycline, Hydrocodone, Actical, Bee venom, Bextra [valdecoxib], Esomeprazole, Fish allergy, Honey, Ivp dye [iodinated diagnostic agents], Nexium [esomeprazole magnesium], Oxycodone-acetaminophen, Peanuts [peanut oil], Tetanus toxoids, Tetracycline, and Tramadol  Review of Systems   Review of Systems  Constitutional:  Negative for chills and fever.  HENT:  Negative for ear pain and sore throat.   Eyes:  Negative for pain and visual disturbance.  Respiratory:  Negative for cough and shortness of breath.   Cardiovascular:  Negative for chest pain and palpitations.  Gastrointestinal:  Negative for abdominal pain and vomiting.  Genitourinary:  Negative for dysuria and hematuria.  Musculoskeletal:  Negative for arthralgias and back pain.  Skin:  Negative for color change and rash.  Neurological:  Positive for headaches. Negative for seizures and syncope.  All other systems reviewed and are negative.  Physical Exam Updated Vital Signs BP (!) 180/68 (BP Location: Left Arm)   Pulse (!) 50   Temp 98.5 F (36.9 C) (Oral)   Resp 16   SpO2 99%   Physical Exam Vitals and nursing note reviewed.  Constitutional:      General: She is not in acute distress.    Appearance: She is well-developed. She is not ill-appearing.  HENT:     Head: Normocephalic and atraumatic.     Nose: Nose normal.     Mouth/Throat:     Mouth: Mucous membranes are moist.   Eyes:     Extraocular Movements: Extraocular movements intact.     Conjunctiva/sclera: Conjunctivae normal.     Pupils: Pupils are equal, round, and reactive to light.  Cardiovascular:     Rate and Rhythm: Normal rate and regular rhythm.     Pulses: Normal pulses.     Heart sounds: Normal heart sounds. No murmur heard. Pulmonary:     Effort: Pulmonary effort is normal. No respiratory distress.     Breath sounds: Normal breath sounds.  Abdominal:     General: Abdomen is flat.     Palpations: Abdomen is soft.     Tenderness: There is no abdominal tenderness.  Musculoskeletal:     Cervical back: Neck supple.  Skin:    General: Skin is warm and  dry.  Neurological:     General: No focal deficit present.     Mental Status: She is alert and oriented to person, place, and time.     Cranial Nerves: No cranial nerve deficit.     Sensory: No sensory deficit.     Motor: No weakness.     Coordination: Coordination normal.     Comments: 5+ out of 5 strength throughout, normal sensation, no drift, normal finger-to-nose finger, normal speech, no visual field deficit  Psychiatric:        Mood and Affect: Mood normal.    ED Results / Procedures / Treatments   Labs (all labs ordered are listed, but only abnormal results are displayed) Labs Reviewed - No data to display  EKG EKG Interpretation  Date/Time:  Thursday August 11 2021 07:43:11 EDT Ventricular Rate:  47 PR Interval:  210 QRS Duration: 89 QT Interval:  474 QTC Calculation: 420 R Axis:   76 Text Interpretation: Sinus bradycardia Consider left atrial enlargement Confirmed by Virgina Norfolk ([id number]) on 08/11/2021 8:00:32 AM  Radiology CT HEAD WO CONTRAST ( )  Result Date: 08/11/2021 CLINICAL DATA:  Headache, elevated blood pressure EXAM: CT HEAD WITHOUT CONTRAST TECHNIQUE: Contiguous axial images were obtained from the base of the skull through the vertex without intravenous contrast. COMPARISON:  None. FINDINGS: Brain: There  is no evidence of acute intracranial hemorrhage, extra-axial fluid collection, or infarct. There is mild parenchymal volume loss with mild enlargement of the ventricular system. There is no mass lesion. There is no midline shift. There is a small remote lacunar infarct in the right occipital lobe. Hypodensity in the remainder of the subcortical and periventricular white matter is nonspecific but likely reflect sequela of chronic white matter microangiopathy. Vascular: There is calcification of the bilateral cavernous ICAs. Skull: Normal. Negative for fracture or focal lesion. Sinuses/Orbits: The imaged paranasal sinuses are clear. The patient is status post bilateral lens implants. The globes and orbits are otherwise unremarkable. Other: None. IMPRESSION: 1. No acute intracranial pathology. 2. Mild parenchymal volume loss and chronic white matter microangiopathy. Electronically Signed   By: Lesia Hausen M.D.   On: 08/11/2021 08:28    Procedures Procedures   Medications Ordered in ED Medications  hydrALAZINE (APRESOLINE) tablet 100 mg (has no administration in time range)    ED Course  I have reviewed the triage vital signs and the nursing notes.  Pertinent labs & imaging results that were available during my care of the patient were reviewed by me and considered in my medical decision making (see chart for details).    MDM Rules/Calculators/A&P                           Vesper Trant is here for high blood pressure, intermittent headaches.  No current headache now but blood pressure elevated to 176/50.  Has not taken her morning medication.  Has noticed some headaches the last several days and also slightly elevated blood pressure as well.  She denies any chest pain or stroke symptoms.  No nausea or vomiting or diarrhea.  Patient has normal neurological exam.  No active chest pain.  Will get head CT given recent headaches high blood pressure and rule out head bleed.  Will check EKG.  No  concern for ACS or other hypertensive emergency at this time.  We will give her morning dose of hydralazine which she has not taken yet.  Lab work was done at other emergency department  last night that was unremarkable.  No significant anemia or electrolyte abnormality.  EKG with no ischemic changes.  Head CT unremarkable.  No head bleed.  No concern for stroke.  Discharged in good condition.  Recommend follow-up with primary care doctor.  This chart was dictated using voice recognition software.  Despite best efforts to proofread,  errors can occur which can change the documentation meaning.   Final Clinical Impression(s) / ED Diagnoses Final diagnoses:  Hypertension, unspecified type    Rx / DC Orders ED Discharge Orders     None        Virgina Norfolk, DO 08/11/21 [phone]

## 2022-03-06 ENCOUNTER — Emergency Department (HOSPITAL_COMMUNITY)
Admission: EM | Admit: 2022-03-06 | Discharge: 2022-03-06 | Disposition: A | Discharge status: Home / Self Care | Payer: Medicare HMO | Attending: Student | Admitting: Student

## 2022-03-06 ENCOUNTER — Encounter (HOSPITAL_COMMUNITY): Payer: Self-pay

## 2022-03-06 ENCOUNTER — Emergency Department (HOSPITAL_COMMUNITY): Payer: Medicare HMO

## 2022-03-06 ENCOUNTER — Other Ambulatory Visit: Payer: Self-pay

## 2022-03-06 DIAGNOSIS — R109 Unspecified abdominal pain: Secondary | ICD-10-CM

## 2022-03-06 DIAGNOSIS — E039 Hypothyroidism, unspecified: Secondary | ICD-10-CM | POA: Insufficient documentation

## 2022-03-06 DIAGNOSIS — R1031 Right lower quadrant pain: Secondary | ICD-10-CM | POA: Diagnosis present

## 2022-03-06 DIAGNOSIS — Z7902 Long term (current) use of antithrombotics/antiplatelets: Secondary | ICD-10-CM | POA: Insufficient documentation

## 2022-03-06 DIAGNOSIS — Z91010 Allergy to peanuts: Secondary | ICD-10-CM | POA: Diagnosis not present

## 2022-03-06 DIAGNOSIS — I10 Essential (primary) hypertension: Secondary | ICD-10-CM | POA: Diagnosis not present

## 2022-03-06 DIAGNOSIS — Z79899 Other long term (current) drug therapy: Secondary | ICD-10-CM | POA: Insufficient documentation

## 2022-03-06 DIAGNOSIS — Z20822 Contact with and (suspected) exposure to covid-19: Secondary | ICD-10-CM | POA: Diagnosis not present

## 2022-03-06 LAB — LIPASE, BLOOD: Lipase: 39 U/L (ref 11–51)

## 2022-03-06 LAB — COMPREHENSIVE METABOLIC PANEL
ALT: 19 U/L (ref 0–44)
AST: 26 U/L (ref 15–41)
Albumin: 4 g/dL (ref 3.5–5.0)
Alkaline Phosphatase: 56 U/L (ref 38–126)
Anion gap: 8 (ref 5–15)
BUN: 20 mg/dL (ref 8–23)
CO2: 27 mmol/L (ref 22–32)
Calcium: 9.8 mg/dL (ref 8.9–10.3)
Chloride: 99 mmol/L (ref 98–111)
Creatinine, Ser: 1.12 mg/dL — ABNORMAL HIGH (ref 0.44–1.00)
GFR, Estimated: 48 mL/min — ABNORMAL LOW (ref >60)
Glucose, Bld: 91 mg/dL (ref 70–99)
Potassium: 3.6 mmol/L (ref 3.5–5.1)
Sodium: 134 mmol/L — ABNORMAL LOW (ref 135–145)
Total Bilirubin: 0.2 mg/dL — ABNORMAL LOW (ref 0.3–1.2)
Total Protein: 7.9 g/dL (ref 6.5–8.1)

## 2022-03-06 LAB — CBC
HCT: 37 % (ref 36.0–46.0)
Hemoglobin: 11.8 g/dL — ABNORMAL LOW (ref 12.0–15.0)
MCH: 29.1 pg (ref 26.0–34.0)
MCHC: 31.9 g/dL (ref 30.0–36.0)
MCV: 91.1 fL (ref 80.0–100.0)
Platelets: 266 10*3/uL (ref 150–400)
RBC: 4.06 MIL/uL (ref 3.87–5.11)
RDW: 13.5 % (ref 11.5–15.5)
WBC: 7 10*3/uL (ref 4.0–10.5)
nRBC: 0 % (ref 0.0–0.2)

## 2022-03-06 LAB — URINALYSIS, ROUTINE W REFLEX MICROSCOPIC
Bilirubin Urine: NEGATIVE
Glucose, UA: NEGATIVE mg/dL
Hgb urine dipstick: NEGATIVE
Ketones, ur: NEGATIVE mg/dL
Nitrite: NEGATIVE
Protein, ur: NEGATIVE mg/dL
Specific Gravity, Urine: 1.005 (ref 1.005–1.030)
pH: 7 (ref 5.0–8.0)

## 2022-03-06 LAB — RESP PANEL BY RT-PCR (FLU A&B, COVID) ARPGX2
Influenza A by PCR: NEGATIVE
Influenza B by PCR: NEGATIVE
SARS Coronavirus 2 by RT PCR: NEGATIVE

## 2022-03-06 MED ORDER — DIPHENHYDRAMINE HCL 50 MG/ML IJ SOLN
12.5000 mg | Freq: Once | INTRAMUSCULAR | Status: AC
Start: 2022-03-06 — End: 2022-03-06
  Administered 2022-03-06: 12.5 mg via INTRAVENOUS
  Filled 2022-03-06: qty 1

## 2022-03-06 MED ORDER — OXYCODONE-ACETAMINOPHEN 5-325 MG PO TABS: 1.0000 | ORAL_TABLET | Freq: Four times a day (QID) | ORAL | 0 refills | Status: DC | PRN

## 2022-03-06 MED ORDER — DICYCLOMINE HCL 10 MG PO CAPS
20.0000 mg | ORAL_CAPSULE | Freq: Once | ORAL | Status: AC
  Administered 2022-03-06: 20 mg via ORAL
  Filled 2022-03-06: qty 2

## 2022-03-06 MED ORDER — LACTATED RINGERS IV BOLUS
1000.0000 mL | Freq: Once | INTRAVENOUS | Status: AC
  Administered 2022-03-06: 1000 mL via INTRAVENOUS

## 2022-03-06 MED ORDER — ONDANSETRON HCL 4 MG/2ML IJ SOLN
4.0000 mg | Freq: Once | INTRAMUSCULAR | Status: AC
  Administered 2022-03-06: 4 mg via INTRAVENOUS
  Filled 2022-03-06: qty 2

## 2022-03-06 MED ORDER — MORPHINE SULFATE (PF) 4 MG/ML IV SOLN
4.0000 mg | Freq: Once | INTRAVENOUS | Status: AC
  Administered 2022-03-06: 4 mg via INTRAVENOUS
  Filled 2022-03-06: qty 1

## 2022-03-06 MED ORDER — DICYCLOMINE HCL 20 MG PO TABS: 20.0000 mg | ORAL_TABLET | Freq: Two times a day (BID) | ORAL | 0 refills | Status: AC

## 2022-03-06 MED ORDER — OXYCODONE-ACETAMINOPHEN 5-325 MG PO TABS: 1.0000 | ORAL_TABLET | Freq: Four times a day (QID) | ORAL | 0 refills | Status: AC | PRN

## 2022-03-06 NOTE — ED Provider Triage Note (Signed)
Emergency Medicine Provider Triage Evaluation Note ? ?Caroline Ware , a 88 y.o. female  was evaluated in triage.  Pt complains of abd pain. ? ?Review of Systems  ?Positive: RLQ pain, dysuria, nausea ?Negative: Fever, diarrhea, constipation, cp, sob, hematuria ? ?Physical Exam  ?BP (!) 161/66 (BP Location: Right Arm)   Pulse 64   Temp 97.9 ?F (36.6 ?C) (Oral)   Resp 16   Ht 5' (1.524 m)   Wt 67.1 kg   SpO2 97%   BMI 28.90 kg/m?  ?Gen:   Awake, no distress   ?Resp:  Normal effort  ?MSK:   Moves extremities without difficulty  ?Other:   ? ?Medical Decision Making  ?Medically screening exam initiated at 1:04 PM.  Appropriate orders placed.  Caroline Ware was informed that the remainder of the evaluation will be completed by another provider, this initial triage assessment does not replace that evaluation, and the importance of remaining in the ED until their evaluation is complete. ? ?RLQ pain x 3 days.  Seen at an ER in [place] several days prior for same, had CT scan of abd/pelvis that was unremarkable.  Sxs persists.  ?  ?Fayrene Helper, PA-C ?03/06/22 1305 ? ?

## 2022-03-06 NOTE — ED Provider Notes (Signed)
?[facility]-EMERGENCY DEPT ?Provider Note ? ?CSN: [id number] ?Arrival date & time: 03/06/22 1236 ? ?Chief Complaint(s) ?Abdominal Pain, Nausea, and Urinary Frequency ? ?HPI ?Caroline Ware is a 88 y.o. female with PMH diverticulitis, CVA, GERD who presents emergency department for evaluation of abdominal pain.  Patient states that pain began 2 days ago and she was seen by an outside emergency department and brings paperwork showing relatively normal laboratory evaluation with a negative CT abdomen pelvis without contrast.  Patient has a contrast allergy dye and is unable to obtained a contrast scan.  She states the pain is primarily in the right lower quadrant radiates to her back.  She states she is having frequent urination.  Denies chest pain, shortness of breath, nausea, vomiting, fever or other systemic symptoms. ? ?\ ? ? ?Abdominal Pain ?Urinary Frequency ?Associated symptoms include abdominal pain.  ? ?Past Medical History ?Past Medical History:  ?Diagnosis Date  ? Arthritis   ? Diverticulitis   ? GERD (gastroesophageal reflux disease)   ? Hypertension   ? take Losartan  ? Hypothyroidism   ? takes synthroid  ? Seasonal allergies   ? Shortness of breath dyspnea   ? with exertion  ? Stroke [facility](H[facility])   ? ?Patient Active Problem List  ? Diagnosis Date Noted  ? Acute blood loss anemia 06/01/2015  ? Postoperative anemia due to acute blood loss 05/27/2015  ?  Class: Acute  ? Infection of urinary tract 05/27/2015  ?  Class: Acute  ? Atelectasis 05/27/2015  ?  Class: Acute  ? Spinal stenosis, lumbar region, with neurogenic claudication 05/25/2015  ?  Class: Chronic  ? Spondylolisthesis of lumbar region 05/25/2015  ?  Class: Chronic  ? Presence of retained hardware 05/25/2015  ?  Class: Chronic  ? ?Home Medication(s) ?Prior to Admission medications   ?Medication Sig Start Date End Date Taking? Authorizing Provider  ?Biotin w/ Vitamins C & E (HAIR SKIN & NAILS GUMMIES PO) Take 1 tablet by mouth daily.     [provider]  ?calcium carbonate (OS-CAL) 600 MG TABS tablet Take 600 mg by mouth 2 (two) times daily with a meal.    [provider]  ?cloNIDine (CATAPRES) 0.1 MG tablet Take 0.1 mg by mouth every 6 (six) hours as needed. Increased blood pressure 08/02/21   [provider]  ?clopidogrel (PLAVIX) 75 MG tablet Take 1 tablet (75 mg total) by mouth daily. 05/28/15   Kerrin ChampagneNitka, James E, MD  ?COMBIGAN 0.2-0.5 % ophthalmic solution Place 1 drop into both eyes every 12 (twelve) hours.  03/08/15   [provider]  ?famotidine (PEPCID) 40 MG tablet Take 40 mg by mouth daily. 06/28/21   [provider]  ?hydrALAZINE (APRESOLINE) 100 MG tablet Take 100 mg by mouth 2 (two) times daily. 08/01/21   [provider]  ?labetalol (NORMODYNE) 100 MG tablet Take 100 mg by mouth 2 (two) times daily. 07/01/21   [provider]  ?loratadine (CLARITIN) 10 MG tablet Take 10 mg by mouth daily.    [provider]  ?LUMIGAN 0.01 % SOLN Place 1 drop into both eyes at bedtime.  04/28/15   [provider]  ?methylPREDNISolone (MEDROL DOSEPAK) 4 MG TBPK tablet Take as directed. ?Patient not taking: No sig reported 06/26/18   Kerrin ChampagneNitka, James E, MD  ?montelukast (SINGULAIR) 10 MG tablet Take 10 mg by mouth at bedtime. 01/30/18   [provider]  ?Multiple Vitamins-Minerals (SENIOR MULTIVITAMIN PLUS PO) Take 1 tablet by mouth daily.  [provider]  ?pantoprazole (PROTONIX) 40 MG tablet TAKE 1 TABLET BY MOUTH EVERY DAY ?Patient not taking: No sig reported 05/20/18   [provider]  ?potassium chloride SA (K-DUR,KLOR-CON) 20 MEQ tablet Take 20 mEq by mouth daily. 02/11/15   [provider]  ?pravastatin (PRAVACHOL) 20 MG tablet Take 20 mg by mouth daily after breakfast.  03/08/15   [provider]  ?SYNTHROID 50 MCG tablet Take 50 mcg by mouth daily before breakfast.  04/02/15   [provider]  ?triamcinolone cream (KENALOG) 0.1 %  Apply 1 application topically 3 (three) times daily as needed for rash. 06/29/21   [provider]  ?                                                                                                                                  ?Past Surgical History ?Past Surgical History:  ?Procedure Laterality Date  ? BACK SURGERY    ? BUNIONECTOMY    ? CARDIAC CATHETERIZATION    ? CHOLECYSTECTOMY    ? colonoscopy    ? hand Right   ? LUMBAR FUSION N/A 05/25/2015  ? Procedure: LUMBAR DECOMPRESSIVE LAMINECTOMY L3-4 AND L4-5, TRANSFORAMINAL LUMBAR INTERBODY FUSION L4-5;  Surgeon: Kerrin Champagne, MD;  Location: [facility] OR;  Service: Orthopedics;  Laterality: N/A;  ? ROTATOR CUFF REPAIR Bilateral   ? ?Family History ?History reviewed. No pertinent family history. ? ?Social History ?Social History  ? ?Tobacco Use  ? Smoking status: Never  ? Smokeless tobacco: Never  ?Vaping Use  ? Vaping Use: Never used  ?Substance Use Topics  ? Alcohol use: Yes  ?  Comment: occasionally  ? Drug use: No  ? ?Allergies ?Betadine [povidone iodine], Ciprofloxacin, Iodine, Metronidazole, Penicillins, Doxycycline, Hydrocodone, Actical, Bee venom, Bextra [valdecoxib], Esomeprazole, Fish allergy, Honey, Ivp dye [iodinated contrast media], Nexium [esomeprazole magnesium], Oxycodone-acetaminophen, Peanuts [peanut oil], Tetanus toxoids, Tetracycline, and Tramadol ? ?Review of Systems ?Review of Systems  ?Gastrointestinal:  Positive for abdominal pain.  ?Genitourinary:  Positive for frequency.  ? ?Physical Exam ?Vital Signs  ?I have reviewed the triage vital signs ?BP (!) 134/55 (BP Location: Left Arm)   Pulse 61   Temp 98.3 ?F (36.8 ?C) (Oral)   Resp 18   Ht 5' (1.524 m)   Wt 67.1 kg   SpO2 100%   BMI 28.90 kg/m?  ? ?Physical Exam ?Vitals and nursing note reviewed.  ?Constitutional:   ?   General: She is not in acute distress. ?   Appearance: She is well-developed. She is ill-appearing.  ?HENT:  ?   Head: Normocephalic and atraumatic.  ?Eyes:  ?    Conjunctiva/sclera: Conjunctivae normal.  ?Cardiovascular:  ?   Rate and Rhythm: Normal rate and regular rhythm.  ?   Heart sounds: No murmur heard. ?Pulmonary:  ?   Effort: Pulmonary effort is normal. No respiratory distress.  ?   Breath sounds: Normal breath sounds.  ?  Abdominal:  ?   Palpations: Abdomen is soft.  ?   Tenderness: There is abdominal tenderness in the right lower quadrant. There is right CVA tenderness.  ?Musculoskeletal:     ?   General: No swelling.  ?   Cervical back: Neck supple.  ?Skin: ?   General: Skin is warm and dry.  ?   Capillary Refill: Capillary refill takes less than 2 seconds.  ?Neurological:  ?   Mental Status: She is alert.  ?Psychiatric:     ?   Mood and Affect: Mood normal.  ? ? ?ED Results and Treatments ?Labs ?(all labs ordered are listed, but only abnormal results are displayed) ?Labs Reviewed  ?COMPREHENSIVE METABOLIC PANEL - Abnormal; Notable for the following components:  ?    Result Value  ? Sodium 134 (*)   ? Creatinine, Ser 1.12 (*)   ? Total Bilirubin 0.2 (*)   ? GFR, Estimated 48 (*)   ? All other components within normal limits  ?CBC - Abnormal; Notable for the following components:  ? Hemoglobin 11.8 (*)   ? All other components within normal limits  ?RESP PANEL BY RT-PCR (FLU A&B, COVID) ARPGX2  ?LIPASE, BLOOD  ?URINALYSIS, ROUTINE W REFLEX MICROSCOPIC  ?                                                                                                                       ? ?Radiology ?No results found. ? ?Pertinent labs & imaging results that were available during my care of the patient were reviewed by me and considered in my medical decision making (see MDM for details). ? ?Medications Ordered in ED ?Medications  ?lactated ringers bolus 1,000 mL (has no administration in time range)  ?morphine (PF) 4 MG/ML injection 4 mg (has no administration in time range)  ?ondansetron (ZOFRAN) injection 4 mg (has no administration in time range)  ?diphenhydrAMINE (BENADRYL)  injection 12.5 mg (has no administration in time range)  ?                                                               ?                                                                    ?Procedures ?Procedures

## 2022-03-06 NOTE — ED Notes (Signed)
Discharge instructions reviewed, questions answered. Rx education provided. Pt states understanding and no further questions. Pt wheeled to vehicle upon discharge. No s/s of distress noted. ?

## 2022-03-06 NOTE — ED Triage Notes (Signed)
Patient c/o RLQ pain, nausea, and urinary frequency x 2 days. Patient states she went to the ED in [place], [place] and patient states she had a CT abdomen. ?
